# Patient Record
Sex: Male | Born: 1950
Health system: Southern US, Community
[De-identification: ages and names within clinical notes are randomized; demographics above are authoritative.]

## PROBLEM LIST (undated history)

## (undated) DIAGNOSIS — I10 Essential (primary) hypertension: Secondary | ICD-10-CM

## (undated) DIAGNOSIS — E291 Testicular hypofunction: Secondary | ICD-10-CM

## (undated) DIAGNOSIS — E785 Hyperlipidemia, unspecified: Secondary | ICD-10-CM

## (undated) HISTORY — DX: Testicular hypofunction: E29.1

## (undated) HISTORY — DX: Essential (primary) hypertension: I10

## (undated) HISTORY — DX: Hyperlipidemia, unspecified: E78.5

---

## 2017-07-15 DIAGNOSIS — M25512 Pain in left shoulder: Secondary | ICD-10-CM | POA: Diagnosis not present

## 2017-07-15 DIAGNOSIS — G8929 Other chronic pain: Secondary | ICD-10-CM | POA: Diagnosis not present

## 2017-07-15 DIAGNOSIS — R399 Unspecified symptoms and signs involving the genitourinary system: Secondary | ICD-10-CM | POA: Diagnosis not present

## 2017-07-15 DIAGNOSIS — E291 Testicular hypofunction: Secondary | ICD-10-CM | POA: Diagnosis not present

## 2017-07-15 DIAGNOSIS — M8589 Other specified disorders of bone density and structure, multiple sites: Secondary | ICD-10-CM | POA: Diagnosis not present

## 2017-07-15 DIAGNOSIS — E559 Vitamin D deficiency, unspecified: Secondary | ICD-10-CM | POA: Diagnosis not present

## 2017-07-15 DIAGNOSIS — E7800 Pure hypercholesterolemia, unspecified: Secondary | ICD-10-CM | POA: Diagnosis not present

## 2017-07-15 DIAGNOSIS — Z125 Encounter for screening for malignant neoplasm of prostate: Secondary | ICD-10-CM | POA: Diagnosis not present

## 2017-07-15 DIAGNOSIS — R400 Somnolence: Secondary | ICD-10-CM | POA: Diagnosis not present

## 2017-11-18 DIAGNOSIS — E7800 Pure hypercholesterolemia, unspecified: Secondary | ICD-10-CM | POA: Diagnosis not present

## 2017-11-18 DIAGNOSIS — K219 Gastro-esophageal reflux disease without esophagitis: Secondary | ICD-10-CM | POA: Diagnosis not present

## 2017-11-18 DIAGNOSIS — R399 Unspecified symptoms and signs involving the genitourinary system: Secondary | ICD-10-CM | POA: Diagnosis not present

## 2017-11-18 DIAGNOSIS — Z125 Encounter for screening for malignant neoplasm of prostate: Secondary | ICD-10-CM | POA: Diagnosis not present

## 2017-11-18 DIAGNOSIS — M25512 Pain in left shoulder: Secondary | ICD-10-CM | POA: Diagnosis not present

## 2017-11-18 DIAGNOSIS — M8589 Other specified disorders of bone density and structure, multiple sites: Secondary | ICD-10-CM | POA: Diagnosis not present

## 2017-11-18 DIAGNOSIS — E291 Testicular hypofunction: Secondary | ICD-10-CM | POA: Diagnosis not present

## 2017-11-18 DIAGNOSIS — R05 Cough: Secondary | ICD-10-CM | POA: Diagnosis not present

## 2017-11-18 DIAGNOSIS — R400 Somnolence: Secondary | ICD-10-CM | POA: Diagnosis not present

## 2017-11-18 DIAGNOSIS — E559 Vitamin D deficiency, unspecified: Secondary | ICD-10-CM | POA: Diagnosis not present

## 2017-11-20 DIAGNOSIS — E7800 Pure hypercholesterolemia, unspecified: Secondary | ICD-10-CM | POA: Diagnosis not present

## 2017-11-20 DIAGNOSIS — R400 Somnolence: Secondary | ICD-10-CM | POA: Diagnosis not present

## 2017-11-20 DIAGNOSIS — E291 Testicular hypofunction: Secondary | ICD-10-CM | POA: Diagnosis not present

## 2017-11-20 DIAGNOSIS — M8589 Other specified disorders of bone density and structure, multiple sites: Secondary | ICD-10-CM | POA: Diagnosis not present

## 2017-11-20 DIAGNOSIS — M25512 Pain in left shoulder: Secondary | ICD-10-CM | POA: Diagnosis not present

## 2017-11-20 DIAGNOSIS — R399 Unspecified symptoms and signs involving the genitourinary system: Secondary | ICD-10-CM | POA: Diagnosis not present

## 2017-11-20 DIAGNOSIS — E559 Vitamin D deficiency, unspecified: Secondary | ICD-10-CM | POA: Diagnosis not present

## 2017-11-20 DIAGNOSIS — Z125 Encounter for screening for malignant neoplasm of prostate: Secondary | ICD-10-CM | POA: Diagnosis not present

## 2017-12-09 DIAGNOSIS — R9389 Abnormal findings on diagnostic imaging of other specified body structures: Secondary | ICD-10-CM | POA: Diagnosis not present

## 2018-01-02 DIAGNOSIS — Z23 Encounter for immunization: Secondary | ICD-10-CM | POA: Diagnosis not present

## 2018-05-11 DIAGNOSIS — M8589 Other specified disorders of bone density and structure, multiple sites: Secondary | ICD-10-CM | POA: Diagnosis not present

## 2018-05-11 DIAGNOSIS — Z125 Encounter for screening for malignant neoplasm of prostate: Secondary | ICD-10-CM | POA: Diagnosis not present

## 2018-05-11 DIAGNOSIS — R739 Hyperglycemia, unspecified: Secondary | ICD-10-CM | POA: Diagnosis not present

## 2018-05-11 DIAGNOSIS — E7800 Pure hypercholesterolemia, unspecified: Secondary | ICD-10-CM | POA: Diagnosis not present

## 2018-05-11 DIAGNOSIS — E559 Vitamin D deficiency, unspecified: Secondary | ICD-10-CM | POA: Diagnosis not present

## 2018-05-11 DIAGNOSIS — R399 Unspecified symptoms and signs involving the genitourinary system: Secondary | ICD-10-CM | POA: Diagnosis not present

## 2018-05-11 DIAGNOSIS — R400 Somnolence: Secondary | ICD-10-CM | POA: Diagnosis not present

## 2018-05-11 DIAGNOSIS — E291 Testicular hypofunction: Secondary | ICD-10-CM | POA: Diagnosis not present

## 2018-05-11 DIAGNOSIS — Z79899 Other long term (current) drug therapy: Secondary | ICD-10-CM | POA: Diagnosis not present

## 2019-02-02 DIAGNOSIS — Z0000 Encounter for general adult medical examination without abnormal findings: Secondary | ICD-10-CM | POA: Diagnosis not present

## 2019-02-02 DIAGNOSIS — K2100 Gastro-esophageal reflux disease with esophagitis, without bleeding: Secondary | ICD-10-CM | POA: Diagnosis not present

## 2019-02-02 DIAGNOSIS — R030 Elevated blood-pressure reading, without diagnosis of hypertension: Secondary | ICD-10-CM | POA: Diagnosis not present

## 2019-02-02 DIAGNOSIS — E7800 Pure hypercholesterolemia, unspecified: Secondary | ICD-10-CM | POA: Diagnosis not present

## 2019-02-02 DIAGNOSIS — E291 Testicular hypofunction: Secondary | ICD-10-CM | POA: Diagnosis not present

## 2019-02-02 DIAGNOSIS — Z23 Encounter for immunization: Secondary | ICD-10-CM | POA: Diagnosis not present

## 2019-02-02 DIAGNOSIS — Z125 Encounter for screening for malignant neoplasm of prostate: Secondary | ICD-10-CM | POA: Diagnosis not present

## 2019-02-02 DIAGNOSIS — E559 Vitamin D deficiency, unspecified: Secondary | ICD-10-CM | POA: Diagnosis not present

## 2019-02-02 DIAGNOSIS — M25512 Pain in left shoulder: Secondary | ICD-10-CM | POA: Diagnosis not present

## 2019-02-03 DIAGNOSIS — E291 Testicular hypofunction: Secondary | ICD-10-CM | POA: Diagnosis not present

## 2019-02-03 DIAGNOSIS — Z125 Encounter for screening for malignant neoplasm of prostate: Secondary | ICD-10-CM | POA: Diagnosis not present

## 2019-02-03 DIAGNOSIS — R030 Elevated blood-pressure reading, without diagnosis of hypertension: Secondary | ICD-10-CM | POA: Diagnosis not present

## 2019-02-03 DIAGNOSIS — Z0000 Encounter for general adult medical examination without abnormal findings: Secondary | ICD-10-CM | POA: Diagnosis not present

## 2019-02-03 DIAGNOSIS — M25512 Pain in left shoulder: Secondary | ICD-10-CM | POA: Diagnosis not present

## 2019-02-03 DIAGNOSIS — E7800 Pure hypercholesterolemia, unspecified: Secondary | ICD-10-CM | POA: Diagnosis not present

## 2019-02-03 DIAGNOSIS — E559 Vitamin D deficiency, unspecified: Secondary | ICD-10-CM | POA: Diagnosis not present

## 2019-02-03 DIAGNOSIS — K2100 Gastro-esophageal reflux disease with esophagitis, without bleeding: Secondary | ICD-10-CM | POA: Diagnosis not present

## 2019-02-03 DIAGNOSIS — Z23 Encounter for immunization: Secondary | ICD-10-CM | POA: Diagnosis not present

## 2019-04-15 ENCOUNTER — Ambulatory Visit: Payer: Self-pay

## 2019-04-20 ENCOUNTER — Ambulatory Visit: Payer: Self-pay

## 2019-04-23 ENCOUNTER — Ambulatory Visit: Payer: Medicare Other | Attending: Internal Medicine

## 2019-04-23 DIAGNOSIS — Z23 Encounter for immunization: Secondary | ICD-10-CM | POA: Insufficient documentation

## 2019-04-23 NOTE — Progress Notes (Signed)
   Covid-19 Vaccination Clinic  Name:  Sirwilliam Friedel    MRN: [id number] DOB: 03-04-1951  04/23/2019  Mr. Capers was observed post Covid-19 immunization for 15 minutes without incidence. He was provided with Vaccine Information Sheet and instruction to access the V-Safe system.   Mr. Schlough was instructed to call 911 with any severe reactions post vaccine: [name] Difficulty breathing  . Swelling of your face and throat  . A fast heartbeat  . A bad rash all over your body  . Dizziness and weakness    Immunizations Administered    Name Date Dose VIS Date Route   Pfizer COVID-19 Vaccine 04/23/2019  5:10 PM 0.3 mL 02/26/2019 Intramuscular   Manufacturer: [place]   Lot: [id number]   [place]: [id number]

## 2019-05-11 DIAGNOSIS — E291 Testicular hypofunction: Secondary | ICD-10-CM | POA: Diagnosis not present

## 2019-05-11 DIAGNOSIS — R011 Cardiac murmur, unspecified: Secondary | ICD-10-CM | POA: Diagnosis not present

## 2019-05-11 DIAGNOSIS — E669 Obesity, unspecified: Secondary | ICD-10-CM | POA: Diagnosis not present

## 2019-05-11 DIAGNOSIS — E559 Vitamin D deficiency, unspecified: Secondary | ICD-10-CM | POA: Diagnosis not present

## 2019-05-11 DIAGNOSIS — E7800 Pure hypercholesterolemia, unspecified: Secondary | ICD-10-CM | POA: Diagnosis not present

## 2019-05-19 ENCOUNTER — Ambulatory Visit: Payer: Medicare Other | Attending: Internal Medicine

## 2019-05-19 DIAGNOSIS — Z23 Encounter for immunization: Secondary | ICD-10-CM | POA: Insufficient documentation

## 2019-05-19 NOTE — Progress Notes (Signed)
   Covid-19 Vaccination Clinic  Name:  Charles Allen    MRN: [id number] DOB: 07/12/1950  05/19/2019  Charles Allen was observed post Covid-19 immunization for 15 minutes without incident. He was provided with Vaccine Information Sheet and instruction to access the V-Safe system.   Charles Allen was instructed to call 911 with any severe reactions post vaccine: [name] Difficulty breathing  . Swelling of face and throat  . A fast heartbeat  . A bad rash all over body  . Dizziness and weakness   Immunizations Administered    Name Date Dose VIS Date Route   Pfizer COVID-19 Vaccine 05/19/2019  1:07 PM 0.3 mL 02/26/2019 Intramuscular   Manufacturer: [place]   Lot: [id number]   [place]: [id number]

## 2019-05-27 DIAGNOSIS — E559 Vitamin D deficiency, unspecified: Secondary | ICD-10-CM | POA: Diagnosis not present

## 2019-05-27 DIAGNOSIS — E291 Testicular hypofunction: Secondary | ICD-10-CM | POA: Diagnosis not present

## 2019-05-27 DIAGNOSIS — E7800 Pure hypercholesterolemia, unspecified: Secondary | ICD-10-CM | POA: Diagnosis not present

## 2020-02-29 DIAGNOSIS — Z125 Encounter for screening for malignant neoplasm of prostate: Secondary | ICD-10-CM | POA: Diagnosis not present

## 2020-02-29 DIAGNOSIS — E559 Vitamin D deficiency, unspecified: Secondary | ICD-10-CM | POA: Diagnosis not present

## 2020-02-29 DIAGNOSIS — E291 Testicular hypofunction: Secondary | ICD-10-CM | POA: Diagnosis not present

## 2020-02-29 DIAGNOSIS — Z1211 Encounter for screening for malignant neoplasm of colon: Secondary | ICD-10-CM | POA: Diagnosis not present

## 2020-02-29 DIAGNOSIS — Z0000 Encounter for general adult medical examination without abnormal findings: Secondary | ICD-10-CM | POA: Diagnosis not present

## 2020-02-29 DIAGNOSIS — E7800 Pure hypercholesterolemia, unspecified: Secondary | ICD-10-CM | POA: Diagnosis not present

## 2020-02-29 DIAGNOSIS — Z1389 Encounter for screening for other disorder: Secondary | ICD-10-CM | POA: Diagnosis not present

## 2020-02-29 DIAGNOSIS — R739 Hyperglycemia, unspecified: Secondary | ICD-10-CM | POA: Diagnosis not present

## 2020-02-29 DIAGNOSIS — Z79899 Other long term (current) drug therapy: Secondary | ICD-10-CM | POA: Diagnosis not present

## 2020-03-02 DIAGNOSIS — Z1211 Encounter for screening for malignant neoplasm of colon: Secondary | ICD-10-CM | POA: Diagnosis not present

## 2020-03-13 DIAGNOSIS — E559 Vitamin D deficiency, unspecified: Secondary | ICD-10-CM | POA: Diagnosis not present

## 2020-03-13 DIAGNOSIS — R739 Hyperglycemia, unspecified: Secondary | ICD-10-CM | POA: Diagnosis not present

## 2020-03-13 DIAGNOSIS — I208 Other forms of angina pectoris: Secondary | ICD-10-CM | POA: Diagnosis not present

## 2020-03-13 DIAGNOSIS — E7800 Pure hypercholesterolemia, unspecified: Secondary | ICD-10-CM | POA: Diagnosis not present

## 2020-03-13 DIAGNOSIS — R011 Cardiac murmur, unspecified: Secondary | ICD-10-CM | POA: Diagnosis not present

## 2020-03-13 DIAGNOSIS — Z79899 Other long term (current) drug therapy: Secondary | ICD-10-CM | POA: Diagnosis not present

## 2020-03-13 DIAGNOSIS — E291 Testicular hypofunction: Secondary | ICD-10-CM | POA: Diagnosis not present

## 2020-03-15 NOTE — Progress Notes (Deleted)
    Patient referred by No ref. provider found for ***  Subjective:   Charles Allen, male    DOB: 1951/01/25, 69 y.o.   MRN: [id number]  *** No chief complaint on file.   *** HPI  69 y.o. *** male with ***  *** No past medical history on file.  *** *** The histories are not reviewed yet. Please review them in the "History" navigator section and refresh this SmartLink.  *** Social History   Tobacco Use  Smoking Status Not on file  Smokeless Tobacco Not on file    Social History   Substance and Sexual Activity  Alcohol Use Not on file    *** No family history on file.  *** No current outpatient medications on file prior to visit.   No current facility-administered medications on file prior to visit.    Cardiovascular and other pertinent studies:  *** EKG 03/13/2020: ***  *** Recent labs: 02/29/2020: Glucose 77, BUN/Cr 17/0.98. EGFR 76. Na/K 139/4.3. ***Rest of the CMP normal H/H 14.1/41.4. MCV 83.0. Platelets 284 ***HbA1C ***% Chol 227, TG 191, HDL ***, LDL 155 ***TSH ***normal   *** ROS      *** There were no vitals filed for this visit.   There is no height or weight on file to calculate BMI. There were no vitals filed for this visit.  *** Objective:   Physical Exam    ***     Assessment & Recommendations:   ***  ***  Thank you for referring the patient to [place]. Please feel free to contact with any questions.   Nigel Mormon, MD Pager: [phone] Office: [phone]

## 2020-03-16 ENCOUNTER — Ambulatory Visit: Payer: Medicare Other | Admitting: Cardiology

## 2020-03-16 ENCOUNTER — Other Ambulatory Visit: Payer: Self-pay

## 2020-03-16 ENCOUNTER — Encounter: Payer: Self-pay | Admitting: Cardiology

## 2020-03-16 VITALS — BP 141/80 | HR 67 | Resp 16 | Ht 67.0 in | Wt 181.0 lb

## 2020-03-16 DIAGNOSIS — E782 Mixed hyperlipidemia: Secondary | ICD-10-CM | POA: Insufficient documentation

## 2020-03-16 DIAGNOSIS — I208 Other forms of angina pectoris: Secondary | ICD-10-CM | POA: Insufficient documentation

## 2020-03-16 DIAGNOSIS — I10 Essential (primary) hypertension: Secondary | ICD-10-CM | POA: Diagnosis not present

## 2020-03-16 MED ORDER — ATORVASTATIN CALCIUM 20 MG PO TABS: 20.0000 mg | ORAL_TABLET | Freq: Every day | ORAL | 3 refills | Status: DC

## 2020-03-16 MED ORDER — AMLODIPINE BESYLATE 5 MG PO TABS: 5.0000 mg | ORAL_TABLET | Freq: Every day | ORAL | 3 refills | Status: DC

## 2020-03-16 NOTE — Progress Notes (Signed)
Patient referred by [name], MD for chest pain  Subjective:   Charles Allen, male    DOB: 1950-03-29, 69 y.o.   MRN: [id number]   Chief Complaint  Patient presents with  . Chest Pain  . New Patient (Initial Visit)     HPI  69 y.o. Caucasian male with hyperlipidemia, hyperglycemia, hypogonadism, referred progression of exercise-induced chest pain.  Patient has noted retrosternal chest pain with walking, improves with rest. He has not had to use nitroglycerin. He has been on lipitor 10 mg in the past, but is very reluctant to start statin therapy. He takes Aspirin 81 mg daily.    Past Medical History:  Diagnosis Date  . Hyperlipidemia   . Hypogonadism in male      History reviewed. No pertinent surgical history.   Social History   Tobacco Use  Smoking Status Never Smoker  Smokeless Tobacco Never Used    Social History   Substance and Sexual Activity  Alcohol Use Yes  . Alcohol/week: 1.0 standard drink  . Types: 1 Glasses of wine per week   Comment: occ     Family History  Problem Relation Age of Onset  . Lung cancer Mother      Current Outpatient Medications on File Prior to Visit  Medication Sig Dispense Refill  . aspirin 81 MG chewable tablet Chew 81 mg by mouth daily.    . cholecalciferol (VITAMIN D) 25 MCG (1000 UNIT) tablet Take 1,000 Units by mouth daily.    . diclofenac Sodium (VOLTAREN) 1 % GEL Apply topically See admin instructions.    . nitroGLYCERIN (NITROSTAT) 0.3 MG SL tablet Place 1 tablet under the tongue as needed.    . testosterone (ANDROGEL) 50 MG/5GM (1%) GEL as directed.    . Turmeric 500 MG CAPS Take 500 mg by mouth daily.     No current facility-administered medications on file prior to visit.    Cardiovascular and other pertinent studies:  EKG 03/16/2020: Sinus rhythm 73 bpm Nrmal EKG  EKG 03/13/2020: Sinus rhythm 75 bpm Normal EKG   Recent labs: 02/29/2020: Glucose 77, BUN/Cr 17/0.98. EGFR 76. Na/K  139/4.3. Rest of the CMP normal H/H 14/41. MCV 83. Platelets 284 HbA1C 5.7% Chol 227, TG 191, HDL 37, LDL 155 Testosterone 266 low    Review of Systems  Cardiovascular: Positive for chest pain. Negative for dyspnea on exertion, leg swelling, palpitations and syncope.         Vitals:   03/16/20 1356 03/16/20 1357  BP: (!) 144/90 (!) 141/80  Pulse: 70 67  Resp: 16   SpO2: 96% 96%     Body mass index is 28.35 kg/m. Filed Weights   03/16/20 1356  Weight: 181 lb (82.1 kg)     Objective:   Physical Exam Vitals and nursing note reviewed.  Constitutional:      General: He is not in acute distress. Neck:     Vascular: No JVD.  Cardiovascular:     Rate and Rhythm: Normal rate and regular rhythm.     Heart sounds: Normal heart sounds. No murmur heard.   Pulmonary:     Effort: Pulmonary effort is normal.     Breath sounds: Normal breath sounds. No wheezing or rales.  Musculoskeletal:     Right lower leg: No edema.     Left lower leg: No edema.         Assessment & Recommendations:   69 y.o. Caucasian male with hyperlipidemia, hyperglycemia, hypogonadism, referred  progression of exercise-induced chest pain.  Stable angina: No unstable symptoms. Continue Aspirin 81 mg. He has agreed to take Lipitor 20 mg Start amlodipine 5 mg, SL NTG Will check exercise nuclear stress test, echocardiogram  Thank you for referring the patient to [place]. Please feel free to contact with any questions.   Nigel Mormon, MD Pager: [phone] Office: [phone]

## 2020-03-20 ENCOUNTER — Ambulatory Visit: Payer: Medicare Other | Admitting: Cardiology

## 2020-03-29 ENCOUNTER — Ambulatory Visit: Payer: Medicare Other

## 2020-03-29 ENCOUNTER — Other Ambulatory Visit: Payer: Self-pay

## 2020-03-29 DIAGNOSIS — I208 Other forms of angina pectoris: Secondary | ICD-10-CM | POA: Diagnosis not present

## 2020-03-30 ENCOUNTER — Other Ambulatory Visit: Payer: Self-pay | Admitting: Cardiology

## 2020-03-30 ENCOUNTER — Ambulatory Visit: Payer: Medicare Other

## 2020-03-30 DIAGNOSIS — I208 Other forms of angina pectoris: Secondary | ICD-10-CM | POA: Diagnosis not present

## 2020-03-30 DIAGNOSIS — Z23 Encounter for immunization: Secondary | ICD-10-CM | POA: Diagnosis not present

## 2020-03-30 MED ORDER — METOPROLOL SUCCINATE ER 25 MG PO TB24: 25.0000 mg | ORAL_TABLET | Freq: Every day | ORAL | 1 refills | Status: DC

## 2020-03-30 NOTE — Progress Notes (Signed)
Agree with stress test findings. Possibility of balanced ischemia.  Start metoprolol succinate 25 mg daily. F/u on 04/10/2020.   Nigel Mormon, MD Pager: [phone] Office: [phone]

## 2020-04-10 ENCOUNTER — Other Ambulatory Visit: Payer: Self-pay

## 2020-04-10 ENCOUNTER — Encounter: Payer: Self-pay | Admitting: Cardiology

## 2020-04-10 ENCOUNTER — Ambulatory Visit: Payer: Medicare Other | Admitting: Cardiology

## 2020-04-10 VITALS — BP 138/77 | HR 72 | Temp 98.2°F | Ht 67.0 in | Wt 178.0 lb

## 2020-04-10 DIAGNOSIS — E782 Mixed hyperlipidemia: Secondary | ICD-10-CM | POA: Diagnosis not present

## 2020-04-10 DIAGNOSIS — I208 Other forms of angina pectoris: Secondary | ICD-10-CM | POA: Diagnosis not present

## 2020-04-10 NOTE — Progress Notes (Signed)
Patient referred by [name], MD for chest pain  Subjective:   Charles Allen, male    DOB: 02-09-1951, 70 y.o.   MRN: [id number]   Chief Complaint  Patient presents with  . Chest Pain  . Follow-up     Chest Pain  Pertinent negatives include no palpitations or syncope.    70 y.o. Caucasian male with mixed hyperlipidemia, stable angina  Patient has been doing well without any symptoms of angina, after being on medical therapy. That said, he has not been the level of activity that would previously produce symptoms-such as walking 1-2 mile.s He shoveled the snow recently without any symptoms.  Discussed the recent echocardiogram and stress test results with the patient, details below.      Current Outpatient Medications on File Prior to Visit  Medication Sig Dispense Refill  . amLODipine (NORVASC) 5 MG tablet Take 1 tablet (5 mg total) by mouth daily. 180 tablet 3  . aspirin 81 MG chewable tablet Chew 81 mg by mouth daily.    [name] atorvastatin (LIPITOR) 20 MG tablet Take 1 tablet (20 mg total) by mouth daily. 90 tablet 3  . b complex vitamins capsule Take 1 capsule by mouth daily.    . cholecalciferol (VITAMIN D) 25 MCG (1000 UNIT) tablet Take 1,000 Units by mouth daily.    . diclofenac Sodium (VOLTAREN) 1 % GEL Apply topically See admin instructions.    . metoprolol succinate (TOPROL-XL) 25 MG 24 hr tablet TAKE 1 TABLET(25 MG) BY MOUTH DAILY WITH OR IMMEDIATELY FOLLOWING A MEAL 90 tablet 0  . nitroGLYCERIN (NITROSTAT) 0.3 MG SL tablet Place 1 tablet under the tongue as needed.    . testosterone (ANDROGEL) 50 MG/5GM (1%) GEL as directed.    . Turmeric 500 MG CAPS Take 500 mg by mouth daily.     No current facility-administered medications on file prior to visit.    Cardiovascular and other pertinent studies:  Echocardiogram 03/30/2020:  Left ventricle cavity is normal in size and wall thickness. Normal global  wall motion. Normal LV systolic function with EF 70%.  Normal diastolic  filling pattern.  Structurally normal trileaflet aortic valve. Mild (Grade I) aortic  regurgitation.  Mild tricuspid regurgitation.  No evidence of pulmonary hypertension.  Exercise Myoview stress test 03/29/2020: 1 Day Rest/Stress Protocol. Functional status: Good. Chest pain: Present, nonlimiting but relieved by sublingual nitro. Reason for stopping exercise: Fatigue. Hypertensive response to exercise: None. Patient exercised for 7 minutes and 33 seconds on Bruce protocol, achieved 9.42 METS, and 88% of APMHR.  Stress ECG positive for ischemia. No obvious evidence of reversible ischemia or prior infarct.  Calculated LVEF 47%, visually appears normal.  Left ventricular wall thickness is preserved without regional wall motion abnormalities. High risk study, due to chest pain with exercise relieved by sublingual nitro, stress ECG positive for ischemia with ectopy at peak exercise, mildly reduced LVEF per gated SPECT.  Clinical correlation required.  EKG 03/16/2020: Sinus rhythm 73 bpm Nrmal EKG  EKG 03/13/2020: Sinus rhythm 75 bpm Normal EKG   Recent labs: 02/29/2020: Glucose 77, BUN/Cr 17/0.98. EGFR 76. Na/K 139/4.3. Rest of the CMP normal H/H 14/41. MCV 83. Platelets 284 HbA1C 5.7% Chol 227, TG 191, HDL 37, LDL 155 Testosterone 266 low    Review of Systems  Cardiovascular: Positive for chest pain. Negative for dyspnea on exertion, leg swelling, palpitations and syncope.         Vitals:   04/10/20 1015  BP: 138/77  Pulse: 72  Temp: 98.2 F (36.8 C)  SpO2: 96%     Body mass index is 27.88 kg/m. Filed Weights   04/10/20 1015  Weight: 178 lb (80.7 kg)     Objective:   Physical Exam Vitals and nursing note reviewed.  Constitutional:      General: He is not in acute distress. Neck:     Vascular: No JVD.  Cardiovascular:     Rate and Rhythm: Normal rate and regular rhythm.     Heart sounds: Normal heart sounds. No murmur  heard.   Pulmonary:     Effort: Pulmonary effort is normal.     Breath sounds: Normal breath sounds. No wheezing or rales.  Musculoskeletal:     Right lower leg: No edema.     Left lower leg: No edema.         Assessment & Recommendations:   70 y.o. Caucasian male with mixed hyperlipidemia, stable angina  Stable angina: Symptoms improved on medical therapy and reduced physical activity. I reviewed stress test results with the patient, in detail.  He had fair exercise capacity, reaching 9 METS.  He did have ischemic changes on EKG, without any ischemia seen on SPECT imaging.  Although 3 times daily was normal, overall picture does raise suspicion of balanced ischemia.  His LVEF was normal.  I explained the differentiation between known left main disease, versus left main disease.  In absence of left main disease and LV dysfunction, continued medical management would be appropriate in absence of symptoms.  However, if he were to have left main disease, this increases risk of sudden death and would warrant revascularization.  Therefore, it is of paramount importance to obtain coronary anatomy visualization.  I offered CT coronary angiogram versus invasive angiography.  In the setting that he does not have any anginal symptoms at this time, he would like to hold off the testing.  She understands the pros and cons of this approach.  I would continue medical management including aspirin, statin, amlodipine, metoprolol at this time.  When patient decides to proceed with it, he will contact us, and I will order a coronary CT angiogram.  Otherwise, I will see him back in 3 months.    Nigel Mormon, MD Pager: [phone] Office: [phone]

## 2020-04-26 ENCOUNTER — Other Ambulatory Visit: Payer: Self-pay | Admitting: Cardiology

## 2020-04-26 DIAGNOSIS — R931 Abnormal findings on diagnostic imaging of heart and coronary circulation: Secondary | ICD-10-CM

## 2020-04-26 DIAGNOSIS — I208 Other forms of angina pectoris: Secondary | ICD-10-CM

## 2020-04-26 NOTE — Addendum Note (Signed)
Addended by: Nigel Mormon on: 04/26/2020 12:38 PM   Modules accepted: Orders

## 2020-04-28 DIAGNOSIS — E782 Mixed hyperlipidemia: Secondary | ICD-10-CM | POA: Diagnosis not present

## 2020-04-28 DIAGNOSIS — I208 Other forms of angina pectoris: Secondary | ICD-10-CM | POA: Diagnosis not present

## 2020-04-29 LAB — BASIC METABOLIC PANEL
BUN/Creatinine Ratio: 13 (ref 10–24)
BUN: 13 mg/dL (ref 8–27)
CO2: 19 mmol/L — ABNORMAL LOW (ref 20–29)
Calcium: 9.3 mg/dL (ref 8.6–10.2)
Chloride: 104 mmol/L (ref 96–106)
Creatinine, Ser: 1.04 mg/dL (ref 0.76–1.27)
GFR calc Af Amer: 85 mL/min/{1.73_m2} (ref >59)
GFR calc non Af Amer: 74 mL/min/{1.73_m2} (ref >59)
Glucose: 97 mg/dL (ref 65–99)
Potassium: 4.3 mmol/L (ref 3.5–5.2)
Sodium: 139 mmol/L (ref 134–144)

## 2020-04-29 LAB — LIPID PANEL
Chol/HDL Ratio: 3.1 ratio (ref 0.0–5.0)
Cholesterol, Total: 115 mg/dL (ref 100–199)
HDL: 37 mg/dL — ABNORMAL LOW (ref >39)
LDL Chol Calc (NIH): 63 mg/dL (ref 0–99)
Triglycerides: 70 mg/dL (ref 0–149)
VLDL Cholesterol Cal: 15 mg/dL (ref 5–40)

## 2020-05-01 ENCOUNTER — Telehealth (HOSPITAL_COMMUNITY): Payer: Self-pay | Admitting: Emergency Medicine

## 2020-05-01 ENCOUNTER — Telehealth (HOSPITAL_COMMUNITY): Payer: Self-pay | Admitting: *Deleted

## 2020-05-01 NOTE — Telephone Encounter (Signed)
Attempted to call patient regarding upcoming cardiac CT appointment. °Left message on voicemail with name and callback number ° °Devlon Dosher RN Navigator Cardiac Imaging °[facility] °[phone] Office °[phone] Cell ° °

## 2020-05-01 NOTE — Telephone Encounter (Signed)
Pt returning phone call regarding upcoming cardiac imaging study; pt verbalizes understanding of appt date/time, parking situation and where to check in, pre-test NPO status and medications ordered, and verified current allergies; name and call back number provided for further questions should they arise Charles Bond RN [facility] [phone] office [phone] cell   Pt states he will take 50mg  metoprolol succinate 2 hr prior to scan Clarise Cruz

## 2020-05-03 ENCOUNTER — Ambulatory Visit (HOSPITAL_COMMUNITY)
Admission: RE | Admit: 2020-05-03 | Discharge: 2020-05-03 | Disposition: A | Discharge status: Home / Self Care | Payer: Medicare Other | Source: Ambulatory Visit | Attending: Cardiology | Admitting: Cardiology

## 2020-05-03 ENCOUNTER — Encounter (HOSPITAL_COMMUNITY): Payer: Self-pay

## 2020-05-03 ENCOUNTER — Other Ambulatory Visit: Payer: Self-pay

## 2020-05-03 DIAGNOSIS — I208 Other forms of angina pectoris: Secondary | ICD-10-CM

## 2020-05-03 DIAGNOSIS — Z006 Encounter for examination for normal comparison and control in clinical research program: Secondary | ICD-10-CM

## 2020-05-03 DIAGNOSIS — R931 Abnormal findings on diagnostic imaging of heart and coronary circulation: Secondary | ICD-10-CM

## 2020-05-03 DIAGNOSIS — I2089 Other forms of angina pectoris: Secondary | ICD-10-CM

## 2020-05-03 IMAGING — CT CT HEART MORP W/ CTA COR W/ SCORE W/ CA W/CM &/OR W/O CM
4 of 7 series · 8 of 20 positions shown, 9 images · IV contrast (APPLIED)
Comparison: None.
COMPARISON: None.

Addendum:
EXAM:
OVER-READ INTERPRETATION  CT CHEST

The following report is an over-read performed by radiologist Dr.
Gaio Musa [REDACTED] on 05/03/2020. This
over-read does not include interpretation of cardiac or coronary
anatomy or pathology. The coronary calcium score/coronary CTA
interpretation by the cardiologist is attached.
HISTORY: Chest pain/anginal equiv, ECGs or troponins abnormal
Cardiac/Coronary  CT
TECHNIQUE: The patient was scanned on a Siemens Force scanner.
PROTOCOL: A 120 kV prospective scan was triggered in the descending thoracic
aorta at 111 HU's. Axial non-contrast 3 mm slices were carried out
through the heart. The data set was analyzed on a dedicated work
station and scored using the Agatson method. Gantry rotation speed
was 250 msecs and collimation was .6 mm. No IV beta blockade but
mg of sl NTG was given. The 3D data set was reconstructed in 5%
intervals of the 67-82 % of the R-R cycle. Diastolic phases were
analyzed on a dedicated work station using MPR, MIP and VRT modes.
The patient received 90mL OMNIPAQUE IOHEXOL 350 MG/ML SOLN of
contrast.

[Series 6: best diast 75 % · axial · 0.39mm/px · z∈[+1137,+1174]mm · 2 of 283 slices shown]
[im 95/283  vessel]
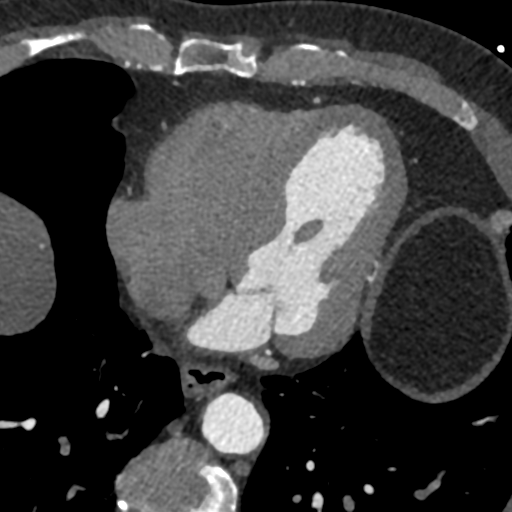
[im 189/283  vessel]
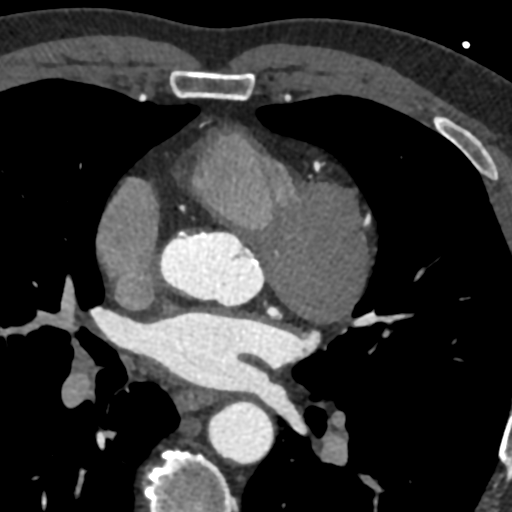

[Series 7: best syst · axial · 0.39mm/px · z∈[+1137,+1174]mm · 2 of 283 slices shown, 3 images]
[im 95/283  vessel]
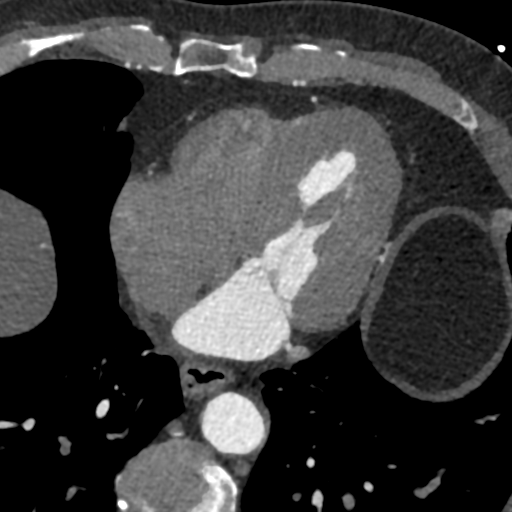
[im 95/283  lung]
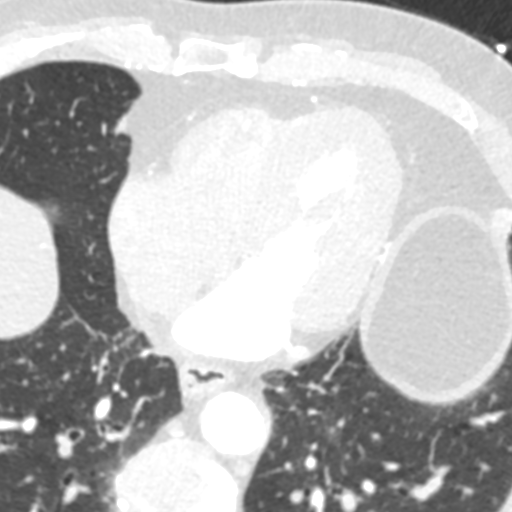
[im 189/283  vessel]
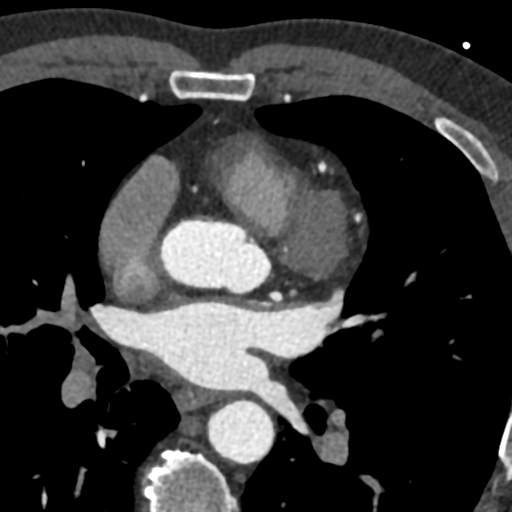

[Series 8: ts diast sharp 75 % · axial · 0.39mm/px · z∈[+1137,+1174]mm · 2 of 283 slices shown]
[im 95/283  lung]
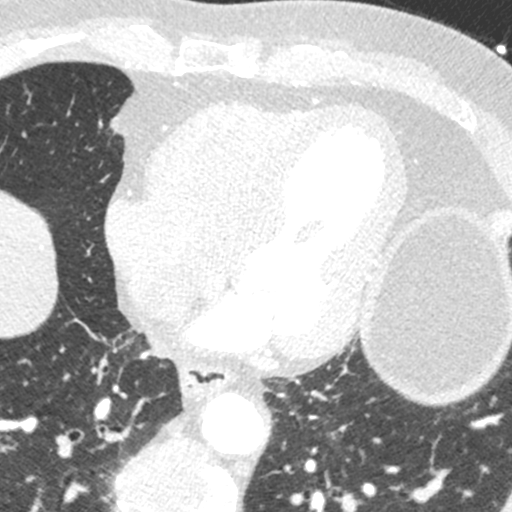
[im 189/283  lung]
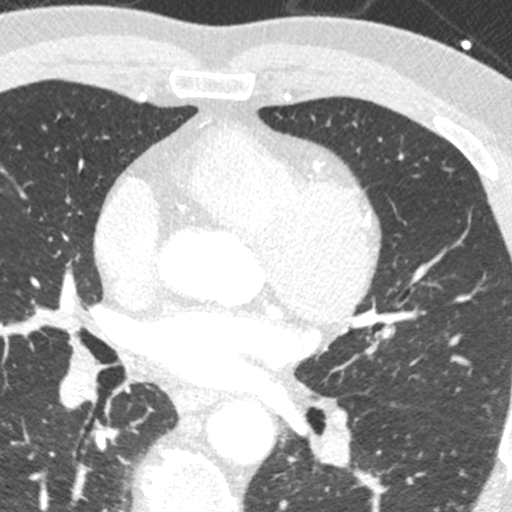

[Series 9: ts syst sharp · axial · 0.39mm/px · z∈[+1137,+1174]mm · 2 of 283 slices shown]
[im 95/283  lung]
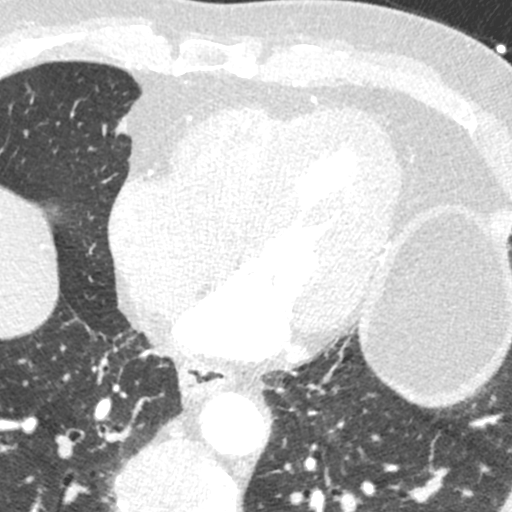
[im 189/283  lung]
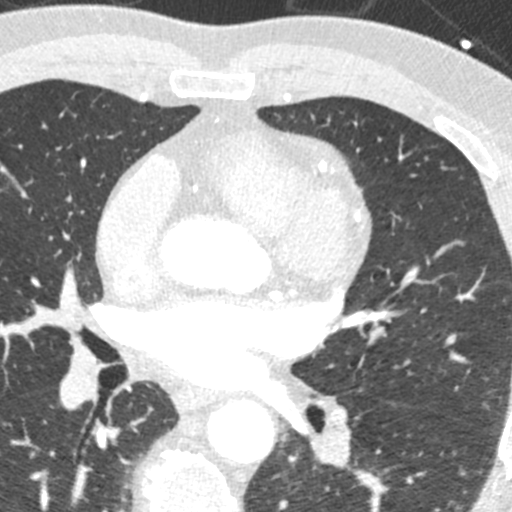

[8 of 20 positions shown; findings below may reference images not displayed]

FINDINGS: Aortic atherosclerosis. Within the visualized portions of the thorax
there are no suspicious appearing pulmonary nodules or masses, there
is no acute consolidative airspace disease, no pleural effusions, no
pneumothorax and no lymphadenopathy. Visualized portions of the
upper abdomen are unremarkable. There are no aggressive appearing
lytic or blastic lesions noted in the visualized portions of the
skeleton.
IMPRESSION: 1.  Aortic Atherosclerosis (NXIYL-YLO.O).
FINDINGS: Image quality: Average.

Noise artifact is: Limited.

Coronary artery calcification score:

Left main: 0

Left anterior descending artery: 195

Left circumflex artery: 152

Right coronary artery: 9

Total calcium score: 356 MOUTON

Coronary calcium score is 356, which places the patient in the 73th
percentile for age and sex matched control.

Coronary arteries: Normal coronary origins.  Left dominance.

Left Main Coronary Artery: The left main is a normal caliber vessel
with a normal take off from the left coronary cusp that bifurcates
to form a left anterior descending artery and a left circumflex
artery. There is no plaque or stenosis.

Left Anterior Descending Coronary Artery: The LAD normal caliber
vessel that wraps the apex and gives off 1 patent diagonal branch.
There is segment of mixed plaque in the proximal to mid LAD that
accounts for moderate to severe stenosis. Mid to distal segment
overall patent. First diagonal branch is large in size and normal
caliber vessel with moderate stenosis (50-69%) at the ostial segment
due to calcified plaque remainder of the vessel is patent.

Left Circumflex Artery: The LCX is dominant and terminates as a PDA
and left posterolateral branch. The LCX gives off 1 patent obtuse
marginal branches. Mild stenosis (25-49%) in the proximal to mid LCx
due to calcified plaque. Mid to distal vessel is patent.

Right Coronary Artery: The RCA is non-dominant vessel with normal
take off from the right coronary cusp. Mild stenosis (25-49%) in the
proximal RCA due to mixed plaque. Mid to distal RCA overall patent
with minimal plaque and without significant stenosis.

Left Atrium: Left atrium is grossly normal in size with no left
atrial appendage filling defect.

Left Ventricle: The ventricular cavity size is grossly within normal
limits. There are no stigmata of prior infarction. There is no
abnormal filling defect.

Pulmonary arteries: Normal in size without proximal filling defect.

Pulmonary veins: Normal pulmonary venous drainage.

Aorta: Normal size, 31 mm at the mid ascending aorta (level of the
PA bifurcation) measured double oblique. Aortic atherosclerosis. No
dissection.

Pericardium: Normal thickness with no significant effusion or
calcium present.

Cardiac valves: The aortic valve is trileaflet with valvular
calcification. The mitral valve is normal structure with minimal
calcification of the mitral apparatus.

Extra-cardiac findings: See attached radiology report for
non-cardiac structures.
IMPRESSION: 1. Coronary calcium score of 356. This was 73th percentile for age
and sex matched control.

2. Normal coronary origin with left dominance.

3.  Ascending aortic atherosclerosis.

4.  Trileaflet aortic valve with valvular calcification.

5. CAD-RADS = 4. Mixed plaque in the proximal to mid LAD that
accounts for moderate to severe stenosis. Moderate stenosis (50-69%)
at the ostial segment of the first diagonal branch due to calcified
plaque. Mild stenosis (25-49%) in the proximal to mid LCx due to
calcified plaque. Mild stenosis (25-49%) in the proximal RCA due to
mixed plaque.

6. Study is sent for CT-FFR to further evaluate the proximal to mid
LAD stenosis. The findings will be performed and reported
separately.

RECOMMENDATIONS:

Consider symptom-guided anti-ischemic pharmacotherapy as well as
risk factor modification per guideline directed care. If either
CT-FFR notes hemodynamically significant stenosis or patient is
symptomatic despite uptitration of anti-anginal therapy recommend
invasive coronary angiography; however, clinical correlation is
strongly required.

*** End of Addendum ***
EXAM:
OVER-READ INTERPRETATION  CT CHEST

The following report is an over-read performed by radiologist Dr.
Gaio Musa [REDACTED] on 05/03/2020. This
over-read does not include interpretation of cardiac or coronary
anatomy or pathology. The coronary calcium score/coronary CTA
interpretation by the cardiologist is attached.
FINDINGS: Aortic atherosclerosis. Within the visualized portions of the thorax
there are no suspicious appearing pulmonary nodules or masses, there
is no acute consolidative airspace disease, no pleural effusions, no
pneumothorax and no lymphadenopathy. Visualized portions of the
upper abdomen are unremarkable. There are no aggressive appearing
lytic or blastic lesions noted in the visualized portions of the
skeleton.
IMPRESSION: 1.  Aortic Atherosclerosis (NXIYL-YLO.O).

## 2020-05-03 MED ORDER — NITROGLYCERIN 0.4 MG SL SUBL: 0.8000 mg | SUBLINGUAL_TABLET | Freq: Once | SUBLINGUAL | Status: DC

## 2020-05-03 MED ORDER — IOHEXOL 350 MG/ML SOLN
90.0000 mL | Freq: Once | INTRAVENOUS | Status: AC | PRN
  Administered 2020-05-03: 90 mL via INTRAVENOUS

## 2020-05-03 MED ORDER — NITROGLYCERIN 0.4 MG SL SUBL
SUBLINGUAL_TABLET | SUBLINGUAL | Status: AC
  Filled 2020-05-03: qty 2

## 2020-05-03 NOTE — Research (Signed)
IDENTIFY Informed Consent                  Subject Name: Charles Allen    Subject met inclusion and exclusion criteria.  The informed consent form, study requirements and expectations were reviewed with the subject and questions and concerns were addressed prior to the signing of the consent form.  The subject verbalized understanding of the trial requirements.  The subject agreed to participate in the IDENTIFY trial and signed the informed consent at 14:14PM on 05/03/20.  The informed consent was obtained prior to performance of any protocol-specific procedures for the subject.  A copy of the signed informed consent was given to the subject and a copy was placed in the subject's medical record.   Cindy Johnson, Research Assistant  

## 2020-05-05 NOTE — Progress Notes (Signed)
Sch for 05/10/2020 @ 1:30pm

## 2020-05-10 ENCOUNTER — Ambulatory Visit: Payer: Medicare Other | Admitting: Cardiology

## 2020-05-10 ENCOUNTER — Other Ambulatory Visit: Payer: Self-pay

## 2020-05-10 ENCOUNTER — Encounter: Payer: Self-pay | Admitting: Cardiology

## 2020-05-10 VITALS — BP 138/74 | HR 73 | Temp 98.2°F | Resp 17 | Ht 67.0 in | Wt 181.6 lb

## 2020-05-10 DIAGNOSIS — I208 Other forms of angina pectoris: Secondary | ICD-10-CM | POA: Diagnosis not present

## 2020-05-10 NOTE — Progress Notes (Signed)
Patient referred by [name], MD for chest pain  Subjective:   Charles Allen, male    DOB: January 06, 1951, 70 y.o.   MRN: [id number]   Chief Complaint  Patient presents with  . Follow-up    1-2 weeks  . Results    70 y.o. Caucasian male with mixed hyperlipidemia, stable angina  Patient recently underwent coronary CT angiogram, details below.  He has not been doing much in terms of heavy exertional physical activity, such as Wel[employer]ol.  It is regular walking, he occasionally gets chest pain.  He has not had to take any nitroglycerin.      Current Outpatient Medications on File Prior to Visit  Medication Sig Dispense Refill  . amLODipine (NORVASC) 5 MG tablet Take 1 tablet (5 mg total) by mouth daily. 180 tablet 3  . aspirin 81 MG chewable tablet Chew 81 mg by mouth daily.    [name] atorvastatin (LIPITOR) 20 MG tablet Take 1 tablet (20 mg total) by mouth daily. 90 tablet 3  . b complex vitamins capsule Take 1 capsule by mouth daily.    . cholecalciferol (VITAMIN D) 25 MCG (1000 UNIT) tablet Take 1,000 Units by mouth daily.    . diclofenac Sodium (VOLTAREN) 1 % GEL Apply topically See admin instructions.    . metoprolol succinate (TOPROL-XL) 25 MG 24 hr tablet TAKE 1 TABLET(25 MG) BY MOUTH DAILY WITH OR IMMEDIATELY FOLLOWING A MEAL 90 tablet 0  . nitroGLYCERIN (NITROSTAT) 0.3 MG SL tablet Place 1 tablet under the tongue as needed.    . testosterone (ANDROGEL) 50 MG/5GM (1%) GEL as directed.    . Turmeric 500 MG CAPS Take 500 mg by mouth daily.     No current facility-administered medications on file prior to visit.    Cardiovascular and other pertinent studies:  Coronary CTA 05/03/2020: 1. Coronary calcium score of 356. This was 75th percentile for age and sex matched control. 2. Normal coronary origin with left dominance. 3.  Ascending aortic atherosclerosis. 4.  Trileaflet aortic valve with valvular calcification. 5. CAD-RADS = 4. Mixed plaque in the proximal to mid LAD  that accounts for moderate to severe stenosis. Moderate stenosis (50-69%) at the ostial segment of the first diagonal branch due to calcified plaque. Mild stenosis (25-49%) in the proximal to mid LCx due to calcified plaque. Mild stenosis (25-49%) in the proximal RCA due to mixed plaque. 6. Study is sent for CT-FFR to further evaluate the proximal to mid LAD stenosis. The findings will be performed and reported separately.  Echocardiogram 03/30/2020:  Left ventricle cavity is normal in size and wall thickness. Normal global  wall motion. Normal LV systolic function with EF 70%. Normal diastolic  filling pattern.  Structurally normal trileaflet aortic valve. Mild (Grade I) aortic  regurgitation.  Mild tricuspid regurgitation.  No evidence of pulmonary hypertension.  Exercise Myoview stress test 03/29/2020: 1 Day Rest/Stress Protocol. Functional status: Good. Chest pain: Present, nonlimiting but relieved by sublingual nitro. Reason for stopping exercise: Fatigue. Hypertensive response to exercise: None. Patient exercised for 7 minutes and 33 seconds on Bruce protocol, achieved 9.42 METS, and 88% of APMHR.  Stress ECG positive for ischemia. No obvious evidence of reversible ischemia or prior infarct.  Calculated LVEF 47%, visually appears normal.  Left ventricular wall thickness is preserved without regional wall motion abnormalities. High risk study, due to chest pain with exercise relieved by sublingual nitro, stress ECG positive for ischemia with ectopy at peak exercise, mildly reduced LVEF per  gated SPECT.  Clinical correlation required.  EKG 03/16/2020: Sinus rhythm 73 bpm Nrmal EKG  EKG 03/13/2020: Sinus rhythm 75 bpm Normal EKG   Recent labs: 04/28/2020: Glucose 97, BUN/Cr 13/1.04. EGFR 74. Na/K 139/4.3.  Chol 115, TG 70, HDL 37, LDL 63  02/29/2020: Glucose 77, BUN/Cr 17/0.98. EGFR 76. Na/K 139/4.3. Rest of the CMP normal H/H 14/41. MCV 83. Platelets 284 HbA1C  5.7% Chol 227, TG 191, HDL 37, LDL 155 Testosterone 266 low    Review of Systems  Cardiovascular: Positive for chest pain. Negative for dyspnea on exertion, leg swelling, palpitations and syncope.         Vitals:   05/10/20 1321  BP: 138/74  Pulse: 73  Resp: 17  Temp: 98.2 F (36.8 C)  SpO2: 97%     Body mass index is 28.44 kg/m. Filed Weights   05/10/20 1321  Weight: 181 lb 9.6 oz (82.4 kg)     Objective:   Physical Exam Vitals and nursing note reviewed.  Constitutional:      General: He is not in acute distress. Neck:     Vascular: No JVD.  Cardiovascular:     Rate and Rhythm: Normal rate and regular rhythm.     Heart sounds: Normal heart sounds. No murmur heard.   Pulmonary:     Effort: Pulmonary effort is normal.     Breath sounds: Normal breath sounds. No wheezing or rales.  Musculoskeletal:     Right lower leg: No edema.     Left lower leg: No edema.         Assessment & Recommendations:   70 y.o. Caucasian male with hypertension, mixed hyperlipidemia, CAD with stable angina  CAD with stable angina: Abnormal stress test, coronary CTA showing high-grade stenosis in proximal LAD, CT FFR pending. Patient is currently experiencing occasional angina episodes with his regular physical activity. Continue aspirin, statin, metoprolol, amlodipine. With his limited physical activity, he is having limited angina symptoms.  On current medical therapy, he would like to hold off any further invasive management.  This is reasonable given his known left main coronary artery disease with stable angina.  However, I have cautioned him against heavy exertional physical activity.    Hypertension: Well-controlled  Mixed hyperlipidemia: LDL down from 155 to 63.  Continue Lipitor 20 mg daily.  F/u in 6 weeks   Nigel Mormon, MD Pager: [phone] Office: [phone]

## 2020-05-24 ENCOUNTER — Other Ambulatory Visit: Payer: Self-pay

## 2020-05-24 DIAGNOSIS — I208 Other forms of angina pectoris: Secondary | ICD-10-CM

## 2020-05-24 MED ORDER — ATORVASTATIN CALCIUM 20 MG PO TABS: 20.0000 mg | ORAL_TABLET | Freq: Every day | ORAL | 0 refills | Status: DC

## 2020-05-24 MED ORDER — METOPROLOL SUCCINATE ER 25 MG PO TB24: ORAL_TABLET | ORAL | 0 refills | Status: DC

## 2020-05-24 MED ORDER — AMLODIPINE BESYLATE 5 MG PO TABS: 5.0000 mg | ORAL_TABLET | Freq: Every day | ORAL | 0 refills | Status: DC

## 2020-06-15 ENCOUNTER — Ambulatory Visit: Payer: Medicare Other | Admitting: Cardiology

## 2020-07-10 ENCOUNTER — Ambulatory Visit: Payer: Medicare Other | Admitting: Cardiology

## 2020-07-10 DIAGNOSIS — Z79899 Other long term (current) drug therapy: Secondary | ICD-10-CM | POA: Diagnosis not present

## 2020-07-10 DIAGNOSIS — R011 Cardiac murmur, unspecified: Secondary | ICD-10-CM | POA: Diagnosis not present

## 2020-07-10 DIAGNOSIS — E7800 Pure hypercholesterolemia, unspecified: Secondary | ICD-10-CM | POA: Diagnosis not present

## 2020-07-10 DIAGNOSIS — E291 Testicular hypofunction: Secondary | ICD-10-CM | POA: Diagnosis not present

## 2020-07-10 DIAGNOSIS — E559 Vitamin D deficiency, unspecified: Secondary | ICD-10-CM | POA: Diagnosis not present

## 2020-07-10 DIAGNOSIS — I208 Other forms of angina pectoris: Secondary | ICD-10-CM | POA: Diagnosis not present

## 2020-07-10 DIAGNOSIS — R739 Hyperglycemia, unspecified: Secondary | ICD-10-CM | POA: Diagnosis not present

## 2020-07-13 DIAGNOSIS — E559 Vitamin D deficiency, unspecified: Secondary | ICD-10-CM | POA: Diagnosis not present

## 2020-07-13 DIAGNOSIS — R011 Cardiac murmur, unspecified: Secondary | ICD-10-CM | POA: Diagnosis not present

## 2020-07-13 DIAGNOSIS — Z79899 Other long term (current) drug therapy: Secondary | ICD-10-CM | POA: Diagnosis not present

## 2020-07-13 DIAGNOSIS — D649 Anemia, unspecified: Secondary | ICD-10-CM | POA: Diagnosis not present

## 2020-07-13 DIAGNOSIS — R7303 Prediabetes: Secondary | ICD-10-CM | POA: Diagnosis not present

## 2020-07-13 DIAGNOSIS — I493 Ventricular premature depolarization: Secondary | ICD-10-CM | POA: Diagnosis not present

## 2020-07-13 DIAGNOSIS — E291 Testicular hypofunction: Secondary | ICD-10-CM | POA: Diagnosis not present

## 2020-07-13 DIAGNOSIS — E7800 Pure hypercholesterolemia, unspecified: Secondary | ICD-10-CM | POA: Diagnosis not present

## 2020-07-13 DIAGNOSIS — I208 Other forms of angina pectoris: Secondary | ICD-10-CM | POA: Diagnosis not present

## 2020-07-14 ENCOUNTER — Other Ambulatory Visit: Payer: Self-pay | Admitting: Cardiology

## 2020-07-14 ENCOUNTER — Encounter: Payer: Self-pay | Admitting: Cardiology

## 2020-07-14 ENCOUNTER — Other Ambulatory Visit: Payer: Self-pay

## 2020-07-14 ENCOUNTER — Ambulatory Visit: Payer: Medicare Other | Admitting: Cardiology

## 2020-07-14 VITALS — BP 110/71 | HR 75 | Temp 98.3°F | Resp 17 | Ht 67.0 in | Wt 181.8 lb

## 2020-07-14 DIAGNOSIS — I208 Other forms of angina pectoris: Secondary | ICD-10-CM

## 2020-07-14 MED ORDER — METOPROLOL SUCCINATE ER 25 MG PO TB24: ORAL_TABLET | ORAL | 1 refills | Status: DC

## 2020-07-14 NOTE — Progress Notes (Signed)
Patient referred by [name], MD for chest pain  Subjective:   Charles Allen, male    DOB: 09-28-1950, 70 y.o.   MRN: [id number]   Chief Complaint  Patient presents with  . Chest Pain  . Hypertension    70 y.o. Caucasian male with mixed hyperlipidemia, stable angina  Patient underwent coronary CT angiogram in 04/2020 that showed high grade prox LAD stenosis. At that time, given improvement in angina symptoms, he wanted to continue medical management. However, he now reports that his angina frequency and severity has increased. He tells me that sometimes he feels like he is almost going to have a heart attack. He does admit that he has been out of metoprolol for a few days.        Current Outpatient Medications on File Prior to Visit  Medication Sig Dispense Refill  . amLODipine (NORVASC) 5 MG tablet Take 1 tablet (5 mg total) by mouth daily. 90 tablet 0  . ascorbic acid (VITAMIN C) 1000 MG tablet Take 1,000 mg by mouth daily.    [name] aspirin 81 MG chewable tablet Chew 81 mg by mouth daily.    [name] atorvastatin (LIPITOR) 20 MG tablet Take 1 tablet (20 mg total) by mouth daily. 90 tablet 0  . b complex vitamins capsule Take 1 capsule by mouth daily.    . cholecalciferol (VITAMIN D) 25 MCG (1000 UNIT) tablet Take 1,000 Units by mouth daily.    . diclofenac Sodium (VOLTAREN) 1 % GEL Apply topically See admin instructions.    . metoprolol succinate (TOPROL-XL) 25 MG 24 hr tablet TAKE 1 TABLET(25 MG) BY MOUTH DAILY WITH OR IMMEDIATELY FOLLOWING A MEAL 90 tablet 0  . nitroGLYCERIN (NITROSTAT) 0.3 MG SL tablet Place 1 tablet under the tongue as needed.    . Omega 3 1200 MG CAPS daily at 6 (six) AM.    . testosterone (ANDROGEL) 50 MG/5GM (1%) GEL as directed.    . Turmeric 500 MG CAPS Take 500 mg by mouth daily.     No current facility-administered medications on file prior to visit.    Cardiovascular and other pertinent studies:  EKG 07/14/2020: Sinus rhythm 70 bpm Normal  EKG  Coronary CTA 05/03/2020: 1. Coronary calcium score of 356. This was 60th percentile for age and sex matched control. 2. Normal coronary origin with left dominance. 3.  Ascending aortic atherosclerosis. 4.  Trileaflet aortic valve with valvular calcification. 5. CAD-RADS = 4. Mixed plaque in the proximal to mid LAD that accounts for moderate to severe stenosis. Moderate stenosis (50-69%) at the ostial segment of the first diagonal branch due to calcified plaque. Mild stenosis (25-49%) in the proximal to mid LCx due to calcified plaque. Mild stenosis (25-49%) in the proximal RCA due to mixed plaque. 6. Study is sent for CT-FFR to further evaluate the proximal to mid LAD stenosis. The findings will be performed and reported separately.  Echocardiogram 03/30/2020:  Left ventricle cavity is normal in size and wall thickness. Normal global  wall motion. Normal LV systolic function with EF 70%. Normal diastolic  filling pattern.  Structurally normal trileaflet aortic valve. Mild (Grade I) aortic  regurgitation.  Mild tricuspid regurgitation.  No evidence of pulmonary hypertension.  Exercise Myoview stress test 03/29/2020: 1 Day Rest/Stress Protocol. Functional status: Good. Chest pain: Present, nonlimiting but relieved by sublingual nitro. Reason for stopping exercise: Fatigue. Hypertensive response to exercise: None. Patient exercised for 7 minutes and 33 seconds on Bruce protocol, achieved 9.42 METS,  and 88% of APMHR.  Stress ECG positive for ischemia. No obvious evidence of reversible ischemia or prior infarct.  Calculated LVEF 47%, visually appears normal.  Left ventricular wall thickness is preserved without regional wall motion abnormalities. High risk study, due to chest pain with exercise relieved by sublingual nitro, stress ECG positive for ischemia with ectopy at peak exercise, mildly reduced LVEF per gated SPECT.  Clinical correlation required.  Recent  labs: 04/28/2020: Glucose 97, BUN/Cr 13/1.04. EGFR 74. Na/K 139/4.3.  Chol 115, TG 70, HDL 37, LDL 63  02/29/2020: Glucose 77, BUN/Cr 17/0.98. EGFR 76. Na/K 139/4.3. Rest of the CMP normal H/H 14/41. MCV 83. Platelets 284 HbA1C 5.7% Chol 227, TG 191, HDL 37, LDL 155 Testosterone 266 low    Review of Systems  Cardiovascular: Positive for chest pain. Negative for dyspnea on exertion, leg swelling, palpitations and syncope.         Vitals:   07/14/20 1255  BP: 110/71  Pulse: 75  Resp: 17  Temp: 98.3 F (36.8 C)  SpO2: 97%     Body mass index is 28.47 kg/m. Filed Weights   07/14/20 1255  Weight: 181 lb 12.8 oz (82.5 kg)     Objective:   Physical Exam Vitals and nursing note reviewed.  Constitutional:      General: He is not in acute distress. Neck:     Vascular: No JVD.  Cardiovascular:     Rate and Rhythm: Normal rate and regular rhythm.     Heart sounds: Normal heart sounds. No murmur heard.   Pulmonary:     Effort: Pulmonary effort is normal.     Breath sounds: Normal breath sounds. No wheezing or rales.  Musculoskeletal:     Right lower leg: No edema.     Left lower leg: No edema.         Assessment & Recommendations:   70 y.o. Caucasian male with hypertension, mixed hyperlipidemia, CAD with stable angina  CAD with stable angina: Abnormal stress test, coronary CTA showing high-grade stenosis in proximal LAD Now worsening angina symptoms.  Resume metoprolol succinate 25 mg daily.  Continue aspirin, statin, amlodipine. In addition, recommend coronary angiography, and possible intervention. Schedule for cardiac catheterization, and possible angioplasty. We discussed regarding risks, benefits, alternatives to this including stress testing, CTA and continued medical therapy. Patient wants to proceed. Understands <1-2% risk of death, stroke, MI, urgent CABG, bleeding, infection, renal failure but not limited to  these.  Hypertension: Well-controlled  Mixed hyperlipidemia: LDL down from 155 to 63.  Continue Lipitor 20 mg daily.  F/u after cath   Nigel Mormon, MD Pager: [phone] Office: [phone]

## 2020-07-19 DIAGNOSIS — I208 Other forms of angina pectoris: Secondary | ICD-10-CM | POA: Diagnosis not present

## 2020-07-19 DIAGNOSIS — D649 Anemia, unspecified: Secondary | ICD-10-CM | POA: Diagnosis not present

## 2020-07-20 LAB — CBC
Hematocrit: 37.4 % — ABNORMAL LOW (ref 37.5–51.0)
Hemoglobin: 12.4 g/dL — ABNORMAL LOW (ref 13.0–17.7)
MCH: 26.8 pg (ref 26.6–33.0)
MCHC: 33.2 g/dL (ref 31.5–35.7)
MCV: 81 fL (ref 79–97)
Platelets: 325 10*3/uL (ref 150–450)
RBC: 4.63 x10E6/uL (ref 4.14–5.80)
RDW: 13.5 % (ref 11.6–15.4)
WBC: 7.6 10*3/uL (ref 3.4–10.8)

## 2020-07-20 LAB — BASIC METABOLIC PANEL
BUN/Creatinine Ratio: 21 (ref 10–24)
BUN: 24 mg/dL (ref 8–27)
CO2: 26 mmol/L (ref 20–29)
Calcium: 9.4 mg/dL (ref 8.6–10.2)
Chloride: 102 mmol/L (ref 96–106)
Creatinine, Ser: 1.12 mg/dL (ref 0.76–1.27)
Glucose: 88 mg/dL (ref 65–99)
Potassium: 4.8 mmol/L (ref 3.5–5.2)
Sodium: 140 mmol/L (ref 134–144)
eGFR: 72 mL/min/{1.73_m2} (ref >59)

## 2020-07-21 ENCOUNTER — Other Ambulatory Visit (HOSPITAL_COMMUNITY)
Admission: RE | Admit: 2020-07-21 | Discharge: 2020-07-21 | Disposition: A | Discharge status: Home / Self Care | Payer: Medicare Other | Source: Ambulatory Visit | Attending: Cardiology | Admitting: Cardiology

## 2020-07-21 DIAGNOSIS — Z01812 Encounter for preprocedural laboratory examination: Secondary | ICD-10-CM | POA: Diagnosis not present

## 2020-07-21 DIAGNOSIS — Z20822 Contact with and (suspected) exposure to covid-19: Secondary | ICD-10-CM | POA: Diagnosis not present

## 2020-07-21 LAB — SARS CORONAVIRUS 2 (TAT 6-24 HRS): SARS Coronavirus 2: NEGATIVE

## 2020-07-24 ENCOUNTER — Ambulatory Visit: Payer: Medicare Other | Admitting: Cardiology

## 2020-07-25 ENCOUNTER — Other Ambulatory Visit: Payer: Self-pay

## 2020-07-25 ENCOUNTER — Encounter (HOSPITAL_COMMUNITY)
Admission: RE | Disposition: A | Discharge status: Home / Self Care | Payer: Self-pay | Source: Home / Self Care | Attending: Cardiology

## 2020-07-25 ENCOUNTER — Other Ambulatory Visit (HOSPITAL_COMMUNITY): Payer: Self-pay

## 2020-07-25 ENCOUNTER — Ambulatory Visit (HOSPITAL_COMMUNITY)
Admission: RE | Admit: 2020-07-25 | Discharge: 2020-07-25 | Disposition: A | Discharge status: Home / Self Care | Payer: Medicare Other | Attending: Cardiology | Admitting: Cardiology

## 2020-07-25 DIAGNOSIS — Z7982 Long term (current) use of aspirin: Secondary | ICD-10-CM | POA: Insufficient documentation

## 2020-07-25 DIAGNOSIS — I25118 Atherosclerotic heart disease of native coronary artery with other forms of angina pectoris: Secondary | ICD-10-CM | POA: Insufficient documentation

## 2020-07-25 DIAGNOSIS — I2089 Other forms of angina pectoris: Secondary | ICD-10-CM | POA: Diagnosis present

## 2020-07-25 DIAGNOSIS — I10 Essential (primary) hypertension: Secondary | ICD-10-CM | POA: Diagnosis present

## 2020-07-25 DIAGNOSIS — I208 Other forms of angina pectoris: Secondary | ICD-10-CM | POA: Diagnosis present

## 2020-07-25 DIAGNOSIS — E782 Mixed hyperlipidemia: Secondary | ICD-10-CM | POA: Insufficient documentation

## 2020-07-25 DIAGNOSIS — Z95820 Peripheral vascular angioplasty status with implants and grafts: Secondary | ICD-10-CM

## 2020-07-25 DIAGNOSIS — I25110 Atherosclerotic heart disease of native coronary artery with unstable angina pectoris: Secondary | ICD-10-CM | POA: Diagnosis not present

## 2020-07-25 DIAGNOSIS — Z79899 Other long term (current) drug therapy: Secondary | ICD-10-CM | POA: Insufficient documentation

## 2020-07-25 HISTORY — PX: LEFT HEART CATH AND CORONARY ANGIOGRAPHY: CATH118249

## 2020-07-25 HISTORY — PX: INTRAVASCULAR ULTRASOUND/IVUS: CATH118244

## 2020-07-25 HISTORY — PX: CORONARY STENT INTERVENTION: CATH118234

## 2020-07-25 LAB — POCT ACTIVATED CLOTTING TIME
Activated Clotting Time: 255 seconds
Activated Clotting Time: 267 seconds
Activated Clotting Time: 273 seconds
Activated Clotting Time: 273 seconds

## 2020-07-25 SURGERY — LEFT HEART CATH AND CORONARY ANGIOGRAPHY
Anesthesia: LOCAL

## 2020-07-25 MED ORDER — HEPARIN (PORCINE) IN NACL 1000-0.9 UT/500ML-% IV SOLN
INTRAVENOUS | Status: AC
  Filled 2020-07-25: qty 1500

## 2020-07-25 MED ORDER — SODIUM CHLORIDE 0.9 % IV SOLN
INTRAVENOUS | Status: AC | PRN
  Administered 2020-07-25: 83 mL/h via INTRAVENOUS

## 2020-07-25 MED ORDER — NITROGLYCERIN 1 MG/10 ML FOR IR/CATH LAB
INTRA_ARTERIAL | Status: DC | PRN
  Administered 2020-07-25 (×3): 200 ug via INTRACORONARY
  Administered 2020-07-25: 100 ug via INTRACORONARY

## 2020-07-25 MED ORDER — SODIUM CHLORIDE 0.9 % WEIGHT BASED INFUSION
3.0000 mL/kg/h | INTRAVENOUS | Status: AC
  Administered 2020-07-25: 3 mL/kg/h via INTRAVENOUS

## 2020-07-25 MED ORDER — SODIUM CHLORIDE 0.9 % IV SOLN: INTRAVENOUS | Status: AC

## 2020-07-25 MED ORDER — ACETAMINOPHEN 325 MG PO TABS: 650.0000 mg | ORAL_TABLET | ORAL | Status: DC | PRN

## 2020-07-25 MED ORDER — SODIUM CHLORIDE 0.9 % IV SOLN: 250.0000 mL | INTRAVENOUS | Status: DC | PRN

## 2020-07-25 MED ORDER — LABETALOL HCL 5 MG/ML IV SOLN: 10.0000 mg | INTRAVENOUS | Status: DC | PRN

## 2020-07-25 MED ORDER — FENTANYL CITRATE (PF) 100 MCG/2ML IJ SOLN
INTRAMUSCULAR | Status: AC
  Filled 2020-07-25: qty 2

## 2020-07-25 MED ORDER — SODIUM CHLORIDE 0.9% FLUSH: 3.0000 mL | Freq: Two times a day (BID) | INTRAVENOUS | Status: DC

## 2020-07-25 MED ORDER — VERAPAMIL HCL 2.5 MG/ML IV SOLN: INTRAVENOUS | Status: DC | PRN

## 2020-07-25 MED ORDER — HEPARIN SODIUM (PORCINE) 1000 UNIT/ML IJ SOLN
INTRAMUSCULAR | Status: AC
  Filled 2020-07-25: qty 1

## 2020-07-25 MED ORDER — CLOPIDOGREL BISULFATE 300 MG PO TABS
ORAL_TABLET | ORAL | Status: AC
  Filled 2020-07-25: qty 1

## 2020-07-25 MED ORDER — CLOPIDOGREL BISULFATE 75 MG PO TABS: 75.0000 mg | ORAL_TABLET | Freq: Every day | ORAL | Status: DC

## 2020-07-25 MED ORDER — ONDANSETRON HCL 4 MG/2ML IJ SOLN: 4.0000 mg | Freq: Four times a day (QID) | INTRAMUSCULAR | Status: DC | PRN

## 2020-07-25 MED ORDER — MIDAZOLAM HCL 2 MG/2ML IJ SOLN
INTRAMUSCULAR | Status: AC
  Filled 2020-07-25: qty 2

## 2020-07-25 MED ORDER — IOHEXOL 350 MG/ML SOLN
INTRAVENOUS | Status: DC | PRN
  Administered 2020-07-25: 120 mL

## 2020-07-25 MED ORDER — CLOPIDOGREL BISULFATE 75 MG PO TABS
75.0000 mg | ORAL_TABLET | Freq: Every day | ORAL | 1 refills | Status: DC
  Filled 2020-07-25: qty 30, 30d supply, fill #0

## 2020-07-25 MED ORDER — HEPARIN (PORCINE) IN NACL 1000-0.9 UT/500ML-% IV SOLN
INTRAVENOUS | Status: DC | PRN
  Administered 2020-07-25: 500 mL

## 2020-07-25 MED ORDER — NITROGLYCERIN 1 MG/10 ML FOR IR/CATH LAB
INTRA_ARTERIAL | Status: AC
  Filled 2020-07-25: qty 10

## 2020-07-25 MED ORDER — LIDOCAINE HCL (PF) 1 % IJ SOLN
INTRAMUSCULAR | Status: AC
  Filled 2020-07-25: qty 30

## 2020-07-25 MED ORDER — SODIUM CHLORIDE 0.9% FLUSH: 3.0000 mL | INTRAVENOUS | Status: DC | PRN

## 2020-07-25 MED ORDER — HEPARIN SODIUM (PORCINE) 1000 UNIT/ML IJ SOLN
INTRAMUSCULAR | Status: DC | PRN
  Administered 2020-07-25: 2000 [IU] via INTRAVENOUS
  Administered 2020-07-25: 4000 [IU] via INTRAVENOUS
  Administered 2020-07-25: 2000 [IU] via INTRAVENOUS

## 2020-07-25 MED ORDER — ASPIRIN 81 MG PO CHEW
81.0000 mg | CHEWABLE_TABLET | ORAL | Status: AC
  Administered 2020-07-25: 81 mg via ORAL
  Filled 2020-07-25: qty 1

## 2020-07-25 MED ORDER — CLOPIDOGREL BISULFATE 300 MG PO TABS
ORAL_TABLET | ORAL | Status: DC | PRN
  Administered 2020-07-25: 600 mg via ORAL

## 2020-07-25 MED ORDER — MIDAZOLAM HCL 2 MG/2ML IJ SOLN
INTRAMUSCULAR | Status: DC | PRN
  Administered 2020-07-25: 1 mg via INTRAVENOUS

## 2020-07-25 MED ORDER — FENTANYL CITRATE (PF) 100 MCG/2ML IJ SOLN
INTRAMUSCULAR | Status: DC | PRN
  Administered 2020-07-25: 50 ug via INTRAVENOUS

## 2020-07-25 MED ORDER — SODIUM CHLORIDE 0.9 % WEIGHT BASED INFUSION
1.0000 mL/kg/h | INTRAVENOUS | Status: DC
  Administered 2020-07-25 (×2): 250 mL via INTRAVENOUS

## 2020-07-25 MED ORDER — HYDRALAZINE HCL 20 MG/ML IJ SOLN: 10.0000 mg | INTRAMUSCULAR | Status: DC | PRN

## 2020-07-25 MED ORDER — LIDOCAINE HCL (PF) 1 % IJ SOLN
INTRAMUSCULAR | Status: DC | PRN
  Administered 2020-07-25: 3 mL

## 2020-07-25 MED ORDER — HEPARIN SODIUM (PORCINE) 1000 UNIT/ML IJ SOLN
INTRAMUSCULAR | Status: DC | PRN
  Administered 2020-07-25: 3000 [IU] via INTRAVENOUS
  Administered 2020-07-25: 4000 [IU] via INTRAVENOUS

## 2020-07-25 MED ORDER — VERAPAMIL HCL 2.5 MG/ML IV SOLN
INTRAVENOUS | Status: AC
  Filled 2020-07-25: qty 2

## 2020-07-25 SURGICAL SUPPLY — 30 items
BALLN  ~~LOC~~ SAPPHIRE 4.5X12 (BALLOONS) ×1
BALLN SAPPHIRE 2.5X15 (BALLOONS) ×2
BALLN SAPPHIRE 3.0X15 (BALLOONS) ×2
BALLN SAPPHIRE ~~LOC~~ 3.0X8 (BALLOONS) ×2 IMPLANT
BALLN SAPPHIRE ~~LOC~~ 3.5X15 (BALLOONS) ×2 IMPLANT
BALLN WOLVERINE 3.00X10 (BALLOONS) ×2
BALLN ~~LOC~~ SAPPHIRE 4.5X12 (BALLOONS) ×1
BALLOON SAPPHIRE 2.5X15 (BALLOONS) ×1 IMPLANT
BALLOON SAPPHIRE 3.0X15 (BALLOONS) ×1 IMPLANT
BALLOON WOLVERINE 3.00X10 (BALLOONS) ×1 IMPLANT
BALLOON ~~LOC~~ SAPPHIRE 4.5X12 (BALLOONS) ×1 IMPLANT
CATH 5FR JL3.5 JR4 ANG PIG MP (CATHETERS) ×2 IMPLANT
CATH LAUNCHER 6FR EBU 3 (CATHETERS) ×2 IMPLANT
CATH OPTICROSS HD (CATHETERS) ×2 IMPLANT
DEVICE RAD COMP TR BAND LRG (VASCULAR PRODUCTS) ×2 IMPLANT
GLIDESHEATH SLEND A-KIT 6F 22G (SHEATH) ×2 IMPLANT
GUIDEWIRE INQWIRE 1.5J.035X260 (WIRE) ×1 IMPLANT
INQWIRE 1.5J .035X260CM (WIRE) ×2
KIT ENCORE 26 ADVANTAGE (KITS) ×2 IMPLANT
KIT HEART LEFT (KITS) ×2 IMPLANT
PACK CARDIAC CATHETERIZATION (CUSTOM PROCEDURE TRAY) ×2 IMPLANT
SLED PULL BACK IVUS (MISCELLANEOUS) ×2 IMPLANT
STENT SYNERGY XD 3.0X38 (Permanent Stent) ×1 IMPLANT
STENT SYNERGY XD 4.0X16 (Permanent Stent) ×1 IMPLANT
SYNERGY XD 3.0X38 (Permanent Stent) ×2 IMPLANT
SYNERGY XD 4.0X16 (Permanent Stent) ×2 IMPLANT
TRANSDUCER W/STOPCOCK (MISCELLANEOUS) ×2 IMPLANT
TUBING CIL FLEX 10 FLL-RA (TUBING) ×2 IMPLANT
WIRE ASAHI PROWATER 180CM (WIRE) ×2 IMPLANT
WIRE RUNTHROUGH .014X180CM (WIRE) ×4 IMPLANT

## 2020-07-25 NOTE — CV Procedure (Signed)
IVUC guided complex PCI to ostial to mid LAD Overlapping stents Synergy DES 3.0X38 mm & 4.0X16 mm Full note to follow.   Nigel Mormon, MD Pager: [phone] Office: [phone]

## 2020-07-25 NOTE — Progress Notes (Addendum)
Ambulated to the bathroom to void tol well. Discharge instructions reviewed with pt and his wife both voice understanding.

## 2020-07-25 NOTE — H&P (Signed)
OV 07/14/2020 copied for documentation     Subjective:   Charles Allen, male    DOB: [birth year], 70 y.o.   MRN: [id number]  C/C: Chest pain  71 y.o. Caucasian male with mixed hyperlipidemia, stable angina  Patient underwent coronary CT angiogram in 04/2020 that showed high grade prox LAD stenosis. At that time, given improvement in angina symptoms, he wanted to continue medical management. However, he now reports that his angina frequency and severity has increased. He tells me that sometimes he feels like he is almost going to have a heart attack. He does admit that he has been out of metoprolol for a few days.        No current facility-administered medications on file prior to encounter.   Current Outpatient Medications on File Prior to Encounter  Medication Sig Dispense Refill  . amLODipine (NORVASC) 5 MG tablet Take 1 tablet (5 mg total) by mouth daily. 90 tablet 0  . ascorbic acid (VITAMIN C) 1000 MG tablet Take 1,000 mg by mouth daily.    [name] aspirin 81 MG chewable tablet Chew 81 mg by mouth daily.    [name] atorvastatin (LIPITOR) 20 MG tablet Take 1 tablet (20 mg total) by mouth daily. 90 tablet 0  . b complex vitamins capsule Take 1 capsule by mouth daily.    . Cholecalciferol (VITAMIN D3) 125 MCG (5000 UT) TABS Take 5,000 Units by mouth daily.    . diclofenac Sodium (VOLTAREN) 1 % GEL Apply 2 g topically daily as needed (pain).    . nitroGLYCERIN (NITROSTAT) 0.3 MG SL tablet Place 0.3 mg under the tongue every 5 (five) minutes as needed for chest pain.    . Omega-3 Fatty Acids (OMEGA 3 PO) Take 1 capsule by mouth daily at 6 (six) AM.    . testosterone (ANDROGEL) 50 MG/5GM (1%) GEL Place 5 g onto the skin daily. Apply to both shoulders once a day.    . Turmeric 500 MG CAPS Take 1,000 mg by mouth daily.      Cardiovascular and other pertinent studies:  EKG 07/14/2020: Sinus rhythm 70 bpm Normal EKG  Coronary CTA 05/03/2020: 1. Coronary calcium score of 356. This was 63th  percentile for age and sex matched control. 2. Normal coronary origin with left dominance. 3.  Ascending aortic atherosclerosis. 4.  Trileaflet aortic valve with valvular calcification. 5. CAD-RADS = 4. Mixed plaque in the proximal to mid LAD that accounts for moderate to severe stenosis. Moderate stenosis (50-69%) at the ostial segment of the first diagonal branch due to calcified plaque. Mild stenosis (25-49%) in the proximal to mid LCx due to calcified plaque. Mild stenosis (25-49%) in the proximal RCA due to mixed plaque. 6. Study is sent for CT-FFR to further evaluate the proximal to mid LAD stenosis. The findings will be performed and reported separately.  Echocardiogram 03/30/2020:  Left ventricle cavity is normal in size and wall thickness. Normal global  wall motion. Normal LV systolic function with EF 70%. Normal diastolic  filling pattern.  Structurally normal trileaflet aortic valve. Mild (Grade I) aortic  regurgitation.  Mild tricuspid regurgitation.  No evidence of pulmonary hypertension.  Exercise Myoview stress test 03/29/2020: 1 Day Rest/Stress Protocol. Functional status: Good. Chest pain: Present, nonlimiting but relieved by sublingual nitro. Reason for stopping exercise: Fatigue. Hypertensive response to exercise: None. Patient exercised for 7 minutes and 33 seconds on Bruce protocol, achieved 9.42 METS, and 88% of APMHR.  Stress ECG positive for ischemia. No obvious evidence of reversible  ischemia or prior infarct.  Calculated LVEF 47%, visually appears normal.  Left ventricular wall thickness is preserved without regional wall motion abnormalities. High risk study, due to chest pain with exercise relieved by sublingual nitro, stress ECG positive for ischemia with ectopy at peak exercise, mildly reduced LVEF per gated SPECT.  Clinical correlation required.  Recent labs: 04/28/2020: Glucose 97, BUN/Cr 13/1.04. EGFR 74. Na/K 139/4.3.  Chol 115, TG 70, HDL 37,  LDL 63  02/29/2020: Glucose 77, BUN/Cr 17/0.98. EGFR 76. Na/K 139/4.3. Rest of the CMP normal H/H 14/41. MCV 83. Platelets 284 HbA1C 5.7% Chol 227, TG 191, HDL 37, LDL 155 Testosterone 266 low    Review of Systems  Cardiovascular: Positive for chest pain. Negative for dyspnea on exertion, leg swelling, palpitations and syncope.         Vitals:   07/25/20 0833  BP: 136/76  Pulse: 67  Temp: 98.3 F (36.8 C)  SpO2: 100%     Body mass index is 29.05 kg/m. Filed Weights   07/25/20 0833  Weight: 81.6 kg     Objective:   Physical Exam Vitals and nursing note reviewed.  Constitutional:      General: He is not in acute distress. Neck:     Vascular: No JVD.  Cardiovascular:     Rate and Rhythm: Normal rate and regular rhythm.     Heart sounds: Normal heart sounds. No murmur heard.   Pulmonary:     Effort: Pulmonary effort is normal.     Breath sounds: Normal breath sounds. No wheezing or rales.  Musculoskeletal:     Right lower leg: No edema.     Left lower leg: No edema.         Assessment & Recommendations:   70 y.o. Caucasian male with hypertension, mixed hyperlipidemia, CAD with stable angina  CAD with stable angina: Abnormal stress test, coronary CTA showing high-grade stenosis in proximal LAD Now worsening angina symptoms.  Resume metoprolol succinate 25 mg daily.  Continue aspirin, statin, amlodipine. In addition, recommend coronary angiography, and possible intervention. Schedule for cardiac catheterization, and possible angioplasty. We discussed regarding risks, benefits, alternatives to this including stress testing, CTA and continued medical therapy. Patient wants to proceed. Understands <1-2% risk of death, stroke, MI, urgent CABG, bleeding, infection, renal failure but not limited to these.  Hypertension: Well-controlled  Mixed hyperlipidemia: LDL down from 155 to 63.  Continue Lipitor 20 mg daily.  F/u after cath   Nigel Mormon, MD Pager: [phone] Office: [phone]

## 2020-07-25 NOTE — Progress Notes (Signed)
[id number] Education completed with pt and wife who voiced understanding. Stressed importance of plavix with stent. Reviewed NTG use, heart healthy food choices, walking for ex, and CRP 2. Pt interested in attending. Referred to [place]. Graylon Good RN BSN 07/25/2020 3:26 PM

## 2020-07-25 NOTE — Interval H&P Note (Signed)
History and Physical Interval Note:  07/25/2020 10:38 AM  Charles Allen  has presented today for surgery, with the diagnosis of chest pain, HTN.  The various methods of treatment have been discussed with the patient and family. After consideration of risks, benefits and other options for treatment, the patient has consented to  Procedure(s): LEFT HEART CATH AND CORONARY ANGIOGRAPHY (N/A) as a surgical intervention.  The patient's history has been reviewed, patient examined, no change in status, stable for surgery.  I have reviewed the patient's chart and labs.  Questions were answered to the patient's satisfaction.    2016/2017 Appropriate Use Criteria for Coronary Revascularization Clinical Presentation: Diabetes Mellitus? Symptom Status? S/P CABG? Antianginal Therapy (# of long-acting drugs)? Results of Non-invasive testing? FFR/iFR results in all diseased vessels? Patient undergoing renal transplant? Patient undergoing percutaneous valve procedure (TAVR, MitraClip, Others)? Symptom Status:  Ischemic Symptoms  Non-invasive Testing:  High risk  If no or indeterminate stress test, FFR/iFR results in all diseased vessels:  N/A  Diabetes Mellitus:  No  S/P CABG:  No  Antianginal therapy (number of long-acting drugs):  >=2  Patient undergoing renal transplant:  No  Patient undergoing percutaneous valve procedure:  No    newline 1 Vessel Disease PCI CABG  No proximal LAD involvement, No proximal left dominant LCX involvement A (8); Indication 2 M (6); Indication 2   Proximal left dominant LCX involvement A (8); Indication 5 A (8); Indication 5   Proximal LAD involvement A (8); Indication 5 A (8); Indication 5   newline 2 Vessel Disease  No proximal LAD involvement A (8); Indication 8 A (7); Indication 8   Proximal LAD involvement A (8); Indication 11 A (8); Indication 11   newline 3 Vessel Disease  Low disease complexity (e.g., focal stenoses, SYNTAX <=22) A (8); Indication 17 A (8);  Indication 17   Intermediate or high disease complexity (e.g., SYNTAX >=23) M (6); Indication 21 A (9); Indication 21   newline Left Main Disease  Isolated LMCA disease: ostial or midshaft A (7); Indication 24 A (9); Indication 24   Isolated LMCA disease: bifurcation involvement M (6); Indication 25 A (9); Indication 25   LMCA ostial or midshaft, concurrent low disease burden multivessel disease (e.g., 1-2 additional focal stenoses, SYNTAX <=22) A (7); Indication 26 A (9); Indication 26   LMCA ostial or midshaft, concurrent intermediate or high disease burden multivessel disease (e.g., 1-2 additional bifurcation stenoses, long stenoses, SYNTAX >=23) M (4); Indication 27 A (9); Indication 27   LMCA bifurcation involvement, concurrent low disease burden multivessel disease (e.g., 1-2 additional focal stenoses, SYNTAX <=22) M (6); Indication 28 A (9); Indication 28   LMCA bifurcation involvement, concurrent intermediate or high disease burden multivessel disease (e.g., 1-2 additional bifurcation stenoses, long stenoses, SYNTAX >=23) R (3); Indication 29 A (9); Indication 29     Notes: A indicates appropriate. M indicates may be appropriate. R indicates rarely appropriate. Number in parentheses is median score for that indication. Reclassify indicates number of functionally diseased vessels should be decreased given negative FFR/iFR. Re-evaluate the scenario interpreting any FFR/iFR negative vessel as being not significantly stenosed. Disease means involved vessel provides flow to a sufficient amount of myocardium to be clinically important. If FFR testing indicates a vessel is not significant, that vessel should not be considered diseased (and the patient should be reclassified with respect to extent of functionally significant disease). Proximal LAD + proximal left dominant LCX is considered 3 vessel CAD 2 Vessel CAD with  FFR/iFR abnormal in only 1 but not both is considered 1 vessel CAD Disease  complexity includes occlusion, bifurcation, trifurcation, ostial, >20 mm, tortuosity, calcification, thrombus LMCA disease is >=50% by angiography, MLD <2.8 mm, MLA <6 mm2; MLA 6-7.5 mm2 requires further physiologic See Table B for risk stratification based on noninvasive testing   Journal of the [employer] of Cardiology Mar 2017, 23391; DOI: 10.1016/j.jacc.2017.02.001 [email or web address].full-text.pdf   [employer]

## 2020-07-26 ENCOUNTER — Encounter (HOSPITAL_COMMUNITY): Payer: Self-pay | Admitting: Cardiology

## 2020-07-26 ENCOUNTER — Telehealth (HOSPITAL_COMMUNITY): Payer: Self-pay

## 2020-07-26 NOTE — Telephone Encounter (Signed)
Spoke to pt regarding adherence. Prior issues stemmed from travelling and lack of refills. Has maintained adherence since. Didn't have any questions.

## 2020-08-03 DIAGNOSIS — D500 Iron deficiency anemia secondary to blood loss (chronic): Secondary | ICD-10-CM | POA: Diagnosis not present

## 2020-08-03 DIAGNOSIS — I2510 Atherosclerotic heart disease of native coronary artery without angina pectoris: Secondary | ICD-10-CM | POA: Diagnosis not present

## 2020-08-11 ENCOUNTER — Telehealth: Payer: Self-pay

## 2020-08-11 DIAGNOSIS — D509 Iron deficiency anemia, unspecified: Secondary | ICD-10-CM | POA: Diagnosis not present

## 2020-08-11 DIAGNOSIS — Z006 Encounter for examination for normal comparison and control in clinical research program: Secondary | ICD-10-CM

## 2020-08-11 NOTE — Telephone Encounter (Signed)
I have attempted without success to contact this patient by phone for his Identify 90 day follow up phone call. I left a message for patient to return my phone call with my name and callback number. An e-mail was also sent to patient.   

## 2020-08-15 DIAGNOSIS — I2510 Atherosclerotic heart disease of native coronary artery without angina pectoris: Secondary | ICD-10-CM | POA: Insufficient documentation

## 2020-08-15 NOTE — Progress Notes (Signed)
Patient referred by Charles Shanks, MD for chest pain  Subjective:   Charles Allen, male    DOB: 1951/02/11, 70 y.o.   MRN: [id number]   Chief Complaint  Patient presents with  . post cath  . Follow-up    Per MP    70 y.o. Caucasian male with mixed hyperlipidemia, CAD  Patient had successful PCI to LAD on 07/25/2020. He has had significant improvement in angina symptoms, but he has still has symptoms of chest pain after walking for 10 min.Charles Allen  He also reports episodes of lethargy on certain days.  Concurrently, he is being treated for iron deficiency anemia by [facility] gastroenterology, sees Vicie Mutters, PA-C.  He denies any melena or hematochezia.  There has been discussion regarding colonoscopy.  Current Outpatient Medications on File Prior to Visit  Medication Sig Dispense Refill  . amLODipine (NORVASC) 5 MG tablet Take 1 tablet (5 mg total) by mouth daily. 90 tablet 0  . ascorbic acid (VITAMIN C) 1000 MG tablet Take 1,000 mg by mouth daily.    Charles Allen aspirin 81 MG chewable tablet Chew 81 mg by mouth daily.    Charles Allen atorvastatin (LIPITOR) 20 MG tablet Take 1 tablet (20 mg total) by mouth daily. 90 tablet 0  . b complex vitamins capsule Take 1 capsule by mouth daily.    . Cholecalciferol (VITAMIN D3) 125 MCG (5000 UT) TABS Take 5,000 Units by mouth daily.    . clopidogrel (PLAVIX) 75 MG tablet Take 1 tablet (75 mg total) by mouth daily. 30 tablet 1  . Ferrous Sulfate (IRON) 325 (65 Fe) MG TABS Take 1 tablet by mouth daily.    . metoprolol succinate (TOPROL-XL) 25 MG 24 hr tablet TAKE 1 TABLET(25 MG) BY MOUTH DAILY WITH OR IMMEDIATELY FOLLOWING A MEAL (Patient taking differently: Take 25 mg by mouth daily. TAKE 1 TABLET(25 MG) BY MOUTH DAILY WITH OR IMMEDIATELY FOLLOWING A MEAL) 90 tablet 0  . nitroGLYCERIN (NITROSTAT) 0.3 MG SL tablet Place 0.3 mg under the tongue every 5 (five) minutes as needed for chest pain.    . Omega-3 Fatty Acids (OMEGA 3 PO) Take 1 capsule by mouth daily at 6 (six)  AM.    . testosterone (ANDROGEL) 50 MG/5GM (1%) GEL Place 5 g onto the skin daily. Apply to both shoulders once a day.    . Turmeric 500 MG CAPS Take 1,000 mg by mouth daily.     No current facility-administered medications on file prior to visit.    Cardiovascular and other pertinent studies:  EKG 08/16/2020: Sinus rhythm 68 bpm  Normal EKG  Coronary intervention 07/25/2020: LM: Normal LAD: Ostial 40-50% disease, followed by 95% proximal LAD stenosis, followed by diffuse 60% disease of the mid LAD LCx: Large dominant vessel, no significant disease RCA: Small nondominant vessel, no significant disease       Synergy DES 3.0X38 mm, post-dilated with 3.5 mm [place] balloon     Synergy DES 4.0X16 mm, post-dilated with 4.5 mm [place] balloon  LAD stenosis 95%-->0% TIMI flow I-->III  Coronary CTA 05/03/2020: 1. Coronary calcium score of 356. This was 58th percentile for age and sex matched control. 2. Normal coronary origin with left dominance. 3.  Ascending aortic atherosclerosis. 4.  Trileaflet aortic valve with valvular calcification. 5. CAD-RADS = 4. Mixed plaque in the proximal to mid LAD that accounts for moderate to severe stenosis. Moderate stenosis (50-69%) at the ostial segment of the first diagonal branch due to calcified plaque. Mild stenosis (  25-49%) in the proximal to mid LCx due to calcified plaque. Mild stenosis (25-49%) in the proximal RCA due to mixed plaque. 6. Study is sent for CT-FFR to further evaluate the proximal to mid LAD stenosis. The findings will be performed and reported separately.  Echocardiogram 03/30/2020:  Left ventricle cavity is normal in size and wall thickness. Normal global  wall motion. Normal LV systolic function with EF 70%. Normal diastolic  filling pattern.  Structurally normal trileaflet aortic valve. Mild (Grade I) aortic  regurgitation.  Mild tricuspid regurgitation.  No evidence of pulmonary hypertension.  Exercise Myoview stress test  03/29/2020: 1 Day Rest/Stress Protocol. Functional status: Good. Chest pain: Present, nonlimiting but relieved by sublingual nitro. Reason for stopping exercise: Fatigue. Hypertensive response to exercise: None. Patient exercised for 7 minutes and 33 seconds on Bruce protocol, achieved 9.42 METS, and 88% of APMHR.  Stress ECG positive for ischemia. No obvious evidence of reversible ischemia or prior infarct.  Calculated LVEF 47%, visually appears normal.  Left ventricular wall thickness is preserved without regional wall motion abnormalities. High risk study, due to chest pain with exercise relieved by sublingual nitro, stress ECG positive for ischemia with ectopy at peak exercise, mildly reduced LVEF per gated SPECT.  Clinical correlation required.  Recent labs: 07/19/2020: Glucose 88, BUN/Cr 24/1.12. EGFR 72. Na/K 140/4.8.  H/H 12/37. MCV 81. Platelets 325  04/28/2020: Glucose 97, BUN/Cr 13/1.04. EGFR 74. Na/K 139/4.3.  Chol 115, TG 70, HDL 37, LDL 63  02/29/2020: Glucose 77, BUN/Cr 17/0.98. EGFR 76. Na/K 139/4.3. Rest of the CMP normal H/H 14/41. MCV 83. Platelets 284 HbA1C 5.7% Chol 227, TG 191, HDL 37, LDL 155 Testosterone 266 low    Review of Systems  Constitutional: Positive for malaise/fatigue.  Cardiovascular: Positive for chest pain. Negative for dyspnea on exertion, leg swelling, palpitations and syncope.        Vitals:   08/16/20 0935  BP: 114/67  Pulse: 74  Resp: 17  Temp: 98.1 F (36.7 C)  SpO2: 98%     Body mass index is 28.89 kg/m. Filed Weights   08/16/20 0935  Weight: 179 lb (81.2 kg)     Objective:   Physical Exam Vitals and nursing note reviewed.  Constitutional:      General: He is not in acute distress. Neck:     Vascular: No JVD.  Cardiovascular:     Rate and Rhythm: Normal rate and regular rhythm.     Heart sounds: Normal heart sounds. No murmur heard.   Pulmonary:     Effort: Pulmonary effort is normal.     Breath sounds:  Normal breath sounds. No wheezing or rales.  Musculoskeletal:     Right lower leg: No edema.     Left lower leg: No edema.         Assessment & Recommendations:   70 y.o. Caucasian male with hypertension, mixed hyperlipidemia, CAD with stable angina  CAD with stable angina: S/p LAD PCI (07/25/2020) Still has residual chest pain symptoms, although significantly improved compared to prior. I wonder if his ostial diagonal moderate lesion is physiologically significant. Other etiology for chest pain could very well be anemia. I recommend continued treatment for iron deficiency anemia.  Added isosorbide mononitrate 30 mg daily.  If anginal symptoms persist in spite of treatment of an deficiency anemia, and antianginal therapy, could consider repeat coronary angiogram in physiologically guided stenting to diagonal artery. Recommend DPAT with Aspirin and Plavix at least till 01/2021 Continue metoprolol succinate 25 mg  daily, lipitor 20 mg daily, amlodipine 5 mg daily Given his recent stenting, I recommend delaying colonoscopy until 01/2021, unless needed sooner for clinical indication.  Hypertension: Well-controlled  Mixed hyperlipidemia: LDL down from 155 to 63.  Continue Lipitor 20 mg daily.  F/u in 4 weeks   Nigel Mormon, MD Pager: [phone] Office: [phone]

## 2020-08-16 ENCOUNTER — Encounter: Payer: Self-pay | Admitting: Cardiology

## 2020-08-16 ENCOUNTER — Other Ambulatory Visit: Payer: Self-pay

## 2020-08-16 ENCOUNTER — Ambulatory Visit: Payer: Medicare Other | Admitting: Cardiology

## 2020-08-16 VITALS — BP 114/67 | HR 74 | Temp 98.1°F | Resp 17 | Ht 66.0 in | Wt 179.0 lb

## 2020-08-16 DIAGNOSIS — I25118 Atherosclerotic heart disease of native coronary artery with other forms of angina pectoris: Secondary | ICD-10-CM | POA: Diagnosis not present

## 2020-08-16 DIAGNOSIS — I208 Other forms of angina pectoris: Secondary | ICD-10-CM

## 2020-08-16 MED ORDER — ISOSORBIDE MONONITRATE ER 30 MG PO TB24
30.0000 mg | ORAL_TABLET | Freq: Every day | ORAL | 3 refills | Status: DC
Start: 2020-08-16 — End: 2020-10-13

## 2020-08-16 MED ORDER — ASPIRIN 81 MG PO CHEW: 81.0000 mg | CHEWABLE_TABLET | Freq: Every day | ORAL | 3 refills | Status: AC

## 2020-08-16 MED ORDER — METOPROLOL SUCCINATE ER 25 MG PO TB24: 25.0000 mg | ORAL_TABLET | Freq: Every day | ORAL | 3 refills | Status: DC

## 2020-08-16 MED ORDER — ATORVASTATIN CALCIUM 20 MG PO TABS: 20.0000 mg | ORAL_TABLET | Freq: Every day | ORAL | 3 refills | Status: DC

## 2020-08-16 MED ORDER — CLOPIDOGREL BISULFATE 75 MG PO TABS: 75.0000 mg | ORAL_TABLET | Freq: Every day | ORAL | 3 refills | Status: DC

## 2020-08-22 ENCOUNTER — Telehealth: Payer: Self-pay

## 2020-08-22 NOTE — Telephone Encounter (Signed)
This is likely side effect of the medication Isosorbide mononitrate (Imdur). Recommend taking scheduled tylenol 500 mg 2-3 times/day for next few days. If still not able to tolerate, may need to stoop Isosorbide mononitrate.   Thanks MJP

## 2020-08-23 NOTE — Telephone Encounter (Signed)
Called pt no answer, left a VM

## 2020-08-23 NOTE — Telephone Encounter (Signed)
Called patient, NA, LMAM

## 2020-08-24 NOTE — Telephone Encounter (Signed)
Called and spoke to pt regarding medication. Pt stated he will try the tylenol and call us back in a few days to let us know how he is doing.

## 2020-09-07 DIAGNOSIS — D509 Iron deficiency anemia, unspecified: Secondary | ICD-10-CM | POA: Diagnosis not present

## 2020-09-13 NOTE — Progress Notes (Signed)
Patient referred by [name], MD for chest pain  Subjective:   Charles Allen, male    DOB: 1950-06-07, 70 y.o.   MRN: [id number]   Chief Complaint  Patient presents with   Coronary Artery Disease   Stable angina pectoris    Follow-up    4 week    70 y.o. Caucasian male with mixed hyperlipidemia, CAD  Patient had successful PCI to LAD on 07/25/2020. Chest pain has improved. He is tolerating metoprolol and Imdur well. He has had episodes of sudden heart rate increase, lasting for a few min.  On a separate note, he is being treated for iron deficiency anemia by [facility] gastroenterology, sees Vicie Mutters, PA-C.  He denies any melena or hematochezia.  There has been discussion regarding colonoscopy.  Current Outpatient Medications on File Prior to Visit  Medication Sig Dispense Refill   amLODipine (NORVASC) 5 MG tablet Take 1 tablet (5 mg total) by mouth daily. 90 tablet 0   ascorbic acid (VITAMIN C) 1000 MG tablet Take 1,000 mg by mouth daily.     aspirin 81 MG chewable tablet Chew 1 tablet (81 mg total) by mouth daily. 90 tablet 3   atorvastatin (LIPITOR) 20 MG tablet Take 1 tablet (20 mg total) by mouth daily. 90 tablet 3   b complex vitamins capsule Take 1 capsule by mouth daily.     Cholecalciferol (VITAMIN D3) 125 MCG (5000 UT) TABS Take 5,000 Units by mouth daily.     clopidogrel (PLAVIX) 75 MG tablet Take 1 tablet (75 mg total) by mouth daily. 90 tablet 3   Ferrous Sulfate (IRON) 325 (65 Fe) MG TABS Take 1 tablet by mouth daily.     isosorbide mononitrate (IMDUR) 30 MG 24 hr tablet Take 1 tablet (30 mg total) by mouth daily. 30 tablet 3   metoprolol succinate (TOPROL-XL) 25 MG 24 hr tablet Take 1 tablet (25 mg total) by mouth daily. TAKE 1 TABLET(25 MG) BY MOUTH DAILY WITH OR IMMEDIATELY FOLLOWING A MEAL 90 tablet 3   nitroGLYCERIN (NITROSTAT) 0.3 MG SL tablet Place 0.3 mg under the tongue every 5 (five) minutes as needed for chest pain.     Omega-3 Fatty Acids (OMEGA 3  PO) Take 1 capsule by mouth daily at 6 (six) AM.     testosterone (ANDROGEL) 50 MG/5GM (1%) GEL Place 5 g onto the skin daily. Apply to both shoulders once a day.     Turmeric 500 MG CAPS Take 1,000 mg by mouth daily.     No current facility-administered medications on file prior to visit.    Cardiovascular and other pertinent studies:  EKG 08/16/2020: Sinus rhythm 68 bpm  Normal EKG  Coronary intervention 07/25/2020: LM: Normal LAD: Ostial 40-50% disease, followed by 95% proximal LAD stenosis, followed by diffuse 60% disease of the mid LAD LCx: Large dominant vessel, no significant disease RCA: Small nondominant vessel, no significant disease        Synergy DES 3.0X38 mm, post-dilated with 3.5 mm [place] balloon     Synergy DES 4.0X16 mm, post-dilated with 4.5 mm [place] balloon   LAD stenosis 95%-->0% TIMI flow I-->III  Coronary CTA 05/03/2020: 1. Coronary calcium score of 356. This was 39th percentile for age and sex matched control. 2. Normal coronary origin with left dominance. 3.  Ascending aortic atherosclerosis. 4.  Trileaflet aortic valve with valvular calcification. 5. CAD-RADS = 4. Mixed plaque in the proximal to mid LAD that accounts for moderate to severe stenosis.  Moderate stenosis (50-69%) at the ostial segment of the first diagonal branch due to calcified plaque. Mild stenosis (25-49%) in the proximal to mid LCx due to calcified plaque. Mild stenosis (25-49%) in the proximal RCA due to mixed plaque. 6. Study is sent for CT-FFR to further evaluate the proximal to mid LAD stenosis. The findings will be performed and reported separately.  Echocardiogram 03/30/2020:  Left ventricle cavity is normal in size and wall thickness. Normal global  wall motion. Normal LV systolic function with EF 70%. Normal diastolic  filling pattern.  Structurally normal trileaflet aortic valve.  Mild (Grade I) aortic  regurgitation.  Mild tricuspid regurgitation.  No evidence of pulmonary  hypertension.  Exercise Myoview stress test 03/29/2020: 1 Day Rest/Stress Protocol. Functional status: Good. Chest pain: Present, nonlimiting but relieved by sublingual nitro. Reason for stopping exercise: Fatigue. Hypertensive response to exercise: None. Patient exercised for 7 minutes and 33 seconds on Bruce protocol, achieved 9.42 METS, and 88% of APMHR.  Stress ECG positive for ischemia. No obvious evidence of reversible ischemia or prior infarct.  Calculated LVEF 47%, visually appears normal.  Left ventricular wall thickness is preserved without regional wall motion abnormalities. High risk study, due to chest pain with exercise relieved by sublingual nitro, stress ECG positive for ischemia with ectopy at peak exercise, mildly reduced LVEF per gated SPECT.  Clinical correlation required.  Recent labs: 07/19/2020: Glucose 88, BUN/Cr 24/1.12. EGFR 72. Na/K 140/4.8.  H/H 12/37. MCV 81. Platelets 325  04/28/2020: Glucose 97, BUN/Cr 13/1.04. EGFR 74. Na/K 139/4.3.  Chol 115, TG 70, HDL 37, LDL 63  02/29/2020: Glucose 77, BUN/Cr 17/0.98. EGFR 76. Na/K 139/4.3. Rest of the CMP normal H/H 14/41. MCV 83. Platelets 284 HbA1C 5.7% Chol 227, TG 191, HDL 37, LDL 155 Testosterone 266 low    Review of Systems  Constitutional: Positive for malaise/fatigue.  Cardiovascular:  Positive for chest pain (Improved) and palpitations. Negative for dyspnea on exertion, leg swelling and syncope.       Vitals:   09/14/20 0856  BP: (!) 150/74  Pulse: 72  Resp: 16  Temp: 98.3 F (36.8 C)  SpO2: 97%     Body mass index is 29.54 kg/m. Filed Weights   09/14/20 0856  Weight: 183 lb (83 kg)     Objective:   Physical Exam Vitals and nursing note reviewed.  Constitutional:      General: He is not in acute distress. Neck:     Vascular: No JVD.  Cardiovascular:     Rate and Rhythm: Normal rate and regular rhythm.     Heart sounds: Normal heart sounds. No murmur heard. Pulmonary:      Effort: Pulmonary effort is normal.     Breath sounds: Normal breath sounds. No wheezing or rales.  Musculoskeletal:     Right lower leg: No edema.     Left lower leg: No edema.        Assessment & Recommendations:   70 y.o. Caucasian male with hypertension, mixed hyperlipidemia, CAD with stable angina  CAD with stable angina: S/p LAD PCI (07/25/2020) Improved on metoprolol and Imdur. Recommend DPAT with Aspirin and Plavix at least till 01/2021  Palpitations: Recommend 2 week cardiac telemetry. Increased metoprolol succinate to 50 mg daily.   Hypertension: As above  Mixed hyperlipidemia: LDL down from 155 to 63.  Continue Lipitor 20 mg daily.  F/u in 4 weeks   Nigel Mormon, MD Pager: [phone] Office: [phone]

## 2020-09-14 ENCOUNTER — Ambulatory Visit: Payer: Medicare Other | Admitting: Cardiology

## 2020-09-14 ENCOUNTER — Encounter: Payer: Self-pay | Admitting: Cardiology

## 2020-09-14 ENCOUNTER — Inpatient Hospital Stay: Payer: Medicare Other

## 2020-09-14 ENCOUNTER — Other Ambulatory Visit: Payer: Self-pay

## 2020-09-14 VITALS — BP 150/74 | HR 72 | Temp 98.3°F | Resp 16 | Ht 66.0 in | Wt 183.0 lb

## 2020-09-14 DIAGNOSIS — E782 Mixed hyperlipidemia: Secondary | ICD-10-CM | POA: Diagnosis not present

## 2020-09-14 DIAGNOSIS — I25118 Atherosclerotic heart disease of native coronary artery with other forms of angina pectoris: Secondary | ICD-10-CM | POA: Diagnosis not present

## 2020-09-14 DIAGNOSIS — R002 Palpitations: Secondary | ICD-10-CM

## 2020-09-14 DIAGNOSIS — I208 Other forms of angina pectoris: Secondary | ICD-10-CM | POA: Diagnosis not present

## 2020-09-14 DIAGNOSIS — I2089 Other forms of angina pectoris: Secondary | ICD-10-CM

## 2020-09-14 MED ORDER — AMLODIPINE BESYLATE 5 MG PO TABS: 5.0000 mg | ORAL_TABLET | Freq: Every day | ORAL | 0 refills | Status: DC

## 2020-09-14 MED ORDER — METOPROLOL SUCCINATE ER 50 MG PO TB24: 50.0000 mg | ORAL_TABLET | Freq: Every day | ORAL | 3 refills | Status: DC

## 2020-10-03 DIAGNOSIS — R002 Palpitations: Secondary | ICD-10-CM | POA: Diagnosis not present

## 2020-10-13 ENCOUNTER — Encounter: Payer: Self-pay | Admitting: Cardiology

## 2020-10-13 ENCOUNTER — Other Ambulatory Visit: Payer: Self-pay

## 2020-10-13 ENCOUNTER — Ambulatory Visit: Payer: Medicare Other | Admitting: Cardiology

## 2020-10-13 VITALS — BP 141/76 | HR 65 | Temp 98.1°F | Resp 16 | Ht 66.0 in | Wt 186.8 lb

## 2020-10-13 DIAGNOSIS — I208 Other forms of angina pectoris: Secondary | ICD-10-CM | POA: Diagnosis not present

## 2020-10-13 DIAGNOSIS — I10 Essential (primary) hypertension: Secondary | ICD-10-CM | POA: Diagnosis not present

## 2020-10-13 DIAGNOSIS — R002 Palpitations: Secondary | ICD-10-CM

## 2020-10-13 DIAGNOSIS — E782 Mixed hyperlipidemia: Secondary | ICD-10-CM

## 2020-10-13 DIAGNOSIS — I25118 Atherosclerotic heart disease of native coronary artery with other forms of angina pectoris: Secondary | ICD-10-CM | POA: Diagnosis not present

## 2020-10-13 MED ORDER — CARVEDILOL 6.25 MG PO TABS: 6.2500 mg | ORAL_TABLET | Freq: Two times a day (BID) | ORAL | 3 refills | Status: DC

## 2020-10-13 NOTE — Progress Notes (Signed)
Patient referred by [name], MD for chest pain  Subjective:   Charles Allen, male    DOB: 1950/06/30, 70 y.o.   MRN: [id number]   Chief Complaint  Patient presents with   Palpitations   Coronary artery disease of native artery of native heart wi   Follow-up   Results    70 y.o. Caucasian male with hypertension, mixed hyperlipidemia, CAD s/p LAD PCI (07/2020), palpitations  Patient did not have significant palpitation symptoms where he would monitor.  After returning monitor, he did experience occasional symptoms, but not as bad as before.  He has noticed mood changes, feeling sad, since being on metoprolol.  Also, patient brings colonoscopy at some point to gastroenterology, for his deficiency.  He denies any melena or hematochezia at this time.  Patient has not  Patient had successful PCI to LAD on 07/25/2020. Chest pain has improved. He is tolerating metoprolol and Imdur well. He has had episodes of sudden heart rate increase, lasting for a few min.  Current Outpatient Medications on File Prior to Visit  Medication Sig Dispense Refill   amLODipine (NORVASC) 5 MG tablet Take 1 tablet (5 mg total) by mouth daily. 90 tablet 0   aspirin 81 MG chewable tablet Chew 1 tablet (81 mg total) by mouth daily. 90 tablet 3   atorvastatin (LIPITOR) 20 MG tablet Take 1 tablet (20 mg total) by mouth daily. 90 tablet 3   clopidogrel (PLAVIX) 75 MG tablet Take 1 tablet (75 mg total) by mouth daily. 90 tablet 3   Ferrous Sulfate (IRON) 325 (65 Fe) MG TABS Take 1 tablet by mouth daily.     isosorbide mononitrate (IMDUR) 30 MG 24 hr tablet Take 1 tablet (30 mg total) by mouth daily. 30 tablet 3   metoprolol succinate (TOPROL-XL) 50 MG 24 hr tablet Take 1 tablet (50 mg total) by mouth daily. TAKE 1 TABLET(25 MG) BY MOUTH DAILY WITH OR IMMEDIATELY FOLLOWING A MEAL 30 tablet 3   nitroGLYCERIN (NITROSTAT) 0.3 MG SL tablet Place 0.3 mg under the tongue every 5 (five) minutes as needed for chest pain.      testosterone (ANDROGEL) 50 MG/5GM (1%) GEL Place 5 g onto the skin daily. Apply to both shoulders once a day.     No current facility-administered medications on file prior to visit.    Cardiovascular and other pertinent studies:  Mobile cardiac telemetry 13 days 09/14/2020 - 09/28/2020: Dominant rhythm: Sinus. HR 45-115 bpm. Avg HR 67 bpm, in sinus rhythm. 3 episodes of SVT, fastest at 146 bpm for 4 beats, longest for 7 beats at 109 bpm. <1% isolated SVE, <1 couplet/triplets. 0 episodes of VT <1% isolated VE, <1 couplet/triplets. No atrial fibrillation/atrial flutter/*VT/high grade AV block, sinus pause >3sec noted. 1 patient triggered event correlated with sinus rhythm  EKG 08/16/2020: Sinus rhythm 68 bpm  Normal EKG  Coronary intervention 07/25/2020: LM: Normal LAD: Ostial 40-50% disease, followed by 95% proximal LAD stenosis, followed by diffuse 60% disease of the mid LAD LCx: Large dominant vessel, no significant disease RCA: Small nondominant vessel, no significant disease        Synergy DES 3.0X38 mm, post-dilated with 3.5 mm [place] balloon     Synergy DES 4.0X16 mm, post-dilated with 4.5 mm [place] balloon   LAD stenosis 95%-->0% TIMI flow I-->III  Coronary CTA 05/03/2020: 1. Coronary calcium score of 356. This was 2th percentile for age and sex matched control. 2. Normal coronary origin with left dominance. 3.  Ascending aortic atherosclerosis. 4.  Trileaflet aortic valve with valvular calcification. 5. CAD-RADS = 4. Mixed plaque in the proximal to mid LAD that accounts for moderate to severe stenosis. Moderate stenosis (50-69%) at the ostial segment of the first diagonal branch due to calcified plaque. Mild stenosis (25-49%) in the proximal to mid LCx due to calcified plaque. Mild stenosis (25-49%) in the proximal RCA due to mixed plaque. 6. Study is sent for CT-FFR to further evaluate the proximal to mid LAD stenosis. The findings will be performed and  reported separately.  Echocardiogram 03/30/2020:  Left ventricle cavity is normal in size and wall thickness. Normal global  wall motion. Normal LV systolic function with EF 70%. Normal diastolic  filling pattern.  Structurally normal trileaflet aortic valve.  Mild (Grade I) aortic  regurgitation.  Mild tricuspid regurgitation.  No evidence of pulmonary hypertension.  Exercise Myoview stress test 03/29/2020: 1 Day Rest/Stress Protocol. Functional status: Good. Chest pain: Present, nonlimiting but relieved by sublingual nitro. Reason for stopping exercise: Fatigue. Hypertensive response to exercise: None. Patient exercised for 7 minutes and 33 seconds on Bruce protocol, achieved 9.42 METS, and 88% of APMHR.  Stress ECG positive for ischemia. No obvious evidence of reversible ischemia or prior infarct.  Calculated LVEF 47%, visually appears normal.  Left ventricular wall thickness is preserved without regional wall motion abnormalities. High risk study, due to chest pain with exercise relieved by sublingual nitro, stress ECG positive for ischemia with ectopy at peak exercise, mildly reduced LVEF per gated SPECT.  Clinical correlation required.  Recent labs: 07/19/2020: Glucose 88, BUN/Cr 24/1.12. EGFR 72. Na/K 140/4.8.  H/H 12/37. MCV 81. Platelets 325  04/28/2020: Glucose 97, BUN/Cr 13/1.04. EGFR 74. Na/K 139/4.3.  Chol 115, TG 70, HDL 37, LDL 63  02/29/2020: Glucose 77, BUN/Cr 17/0.98. EGFR 76. Na/K 139/4.3. Rest of the CMP normal H/H 14/41. MCV 83. Platelets 284 HbA1C 5.7% Chol 227, TG 191, HDL 37, LDL 155 Testosterone 266 low    Review of Systems  Constitutional: Positive for malaise/fatigue.  Cardiovascular:  Positive for palpitations (Occasional). Negative for chest pain (Improved), dyspnea on exertion, leg swelling and syncope.       Vitals:   10/13/20 0920  BP: (!) 141/76  Pulse: 65  Resp: 16  Temp: 98.1 F (36.7 C)  SpO2: 96%    Body mass index is 30.15  kg/m. Filed Weights   10/13/20 0920  Weight: 186 lb 12.8 oz (84.7 kg)     Objective:   Physical Exam Vitals and nursing note reviewed.  Constitutional:      General: He is not in acute distress. Neck:     Vascular: No JVD.  Cardiovascular:     Rate and Rhythm: Normal rate and regular rhythm.     Heart sounds: Normal heart sounds. No murmur heard. Pulmonary:     Effort: Pulmonary effort is normal.     Breath sounds: Normal breath sounds. No wheezing or rales.  Musculoskeletal:     Right lower leg: No edema.     Left lower leg: No edema.        Assessment & Recommendations:   70 y.o. Caucasian male with hypertension, mixed hyperlipidemia, CAD s/p LAD PCI (07/2020), palpitations  CAD: S/p LAD PCI (07/25/2020) Angina resolved.  Okay to stop Imdur. Continue aspirin and Plavix.  Given his overlapping proximal LAD stents, I would like to treat him with Plavix for 1 year. However, Plavix and aspirin can be discontinued for 5 days for colonoscopy anytime  after 01/2021. Given mood changes, change metoprolol succinate 50 mg daily to carvedilol 6.25 mg twice daily. Continue Lipitor 20 mg daily.  LDL down from 155 to 63.  Palpitations: No significant arrhythmias on monitor.   Continue beta-blocker, as above, for now.    Hypertension: Slightly suboptimal.  Encourage patient to keep log of blood pressure readings at home, and communicate with me on MyChart.  Mixed hyperlipidemia: As above  F/u in 3 months    Nigel Mormon, MD Pager: [phone] Office: [phone]

## 2020-10-19 ENCOUNTER — Encounter (HOSPITAL_COMMUNITY): Payer: Self-pay

## 2020-10-19 ENCOUNTER — Telehealth (HOSPITAL_COMMUNITY): Payer: Self-pay

## 2020-10-19 NOTE — Telephone Encounter (Signed)
Attempted to call patient in regards to Cardiac Rehab - LM on VM Mailed letter 

## 2020-10-19 NOTE — Telephone Encounter (Signed)
Pt returned CR phone call and stated he is no longer interested b/c "it has been to long".   Closed referral

## 2020-11-06 NOTE — Telephone Encounter (Signed)
From pt

## 2020-11-13 DIAGNOSIS — D500 Iron deficiency anemia secondary to blood loss (chronic): Secondary | ICD-10-CM | POA: Diagnosis not present

## 2020-11-21 ENCOUNTER — Other Ambulatory Visit: Payer: Self-pay

## 2020-11-21 DIAGNOSIS — I208 Other forms of angina pectoris: Secondary | ICD-10-CM

## 2020-11-21 DIAGNOSIS — I25118 Atherosclerotic heart disease of native coronary artery with other forms of angina pectoris: Secondary | ICD-10-CM

## 2020-11-21 MED ORDER — ATORVASTATIN CALCIUM 20 MG PO TABS: 20.0000 mg | ORAL_TABLET | Freq: Every day | ORAL | 3 refills | Status: DC

## 2020-11-21 MED ORDER — AMLODIPINE BESYLATE 5 MG PO TABS: 5.0000 mg | ORAL_TABLET | Freq: Every day | ORAL | 0 refills | Status: DC

## 2020-11-21 MED ORDER — CLOPIDOGREL BISULFATE 75 MG PO TABS: 75.0000 mg | ORAL_TABLET | Freq: Every day | ORAL | 3 refills | Status: DC

## 2020-11-22 ENCOUNTER — Ambulatory Visit: Payer: Medicare Other | Admitting: Cardiology

## 2020-12-14 DIAGNOSIS — I493 Ventricular premature depolarization: Secondary | ICD-10-CM | POA: Diagnosis not present

## 2020-12-14 DIAGNOSIS — R011 Cardiac murmur, unspecified: Secondary | ICD-10-CM | POA: Diagnosis not present

## 2020-12-14 DIAGNOSIS — E7800 Pure hypercholesterolemia, unspecified: Secondary | ICD-10-CM | POA: Diagnosis not present

## 2020-12-14 DIAGNOSIS — I208 Other forms of angina pectoris: Secondary | ICD-10-CM | POA: Diagnosis not present

## 2020-12-14 DIAGNOSIS — D649 Anemia, unspecified: Secondary | ICD-10-CM | POA: Diagnosis not present

## 2020-12-14 DIAGNOSIS — E291 Testicular hypofunction: Secondary | ICD-10-CM | POA: Diagnosis not present

## 2020-12-14 DIAGNOSIS — R7303 Prediabetes: Secondary | ICD-10-CM | POA: Diagnosis not present

## 2020-12-14 DIAGNOSIS — Z79899 Other long term (current) drug therapy: Secondary | ICD-10-CM | POA: Diagnosis not present

## 2020-12-14 DIAGNOSIS — D500 Iron deficiency anemia secondary to blood loss (chronic): Secondary | ICD-10-CM | POA: Diagnosis not present

## 2020-12-14 DIAGNOSIS — E559 Vitamin D deficiency, unspecified: Secondary | ICD-10-CM | POA: Diagnosis not present

## 2020-12-15 DIAGNOSIS — R7303 Prediabetes: Secondary | ICD-10-CM | POA: Diagnosis not present

## 2020-12-15 DIAGNOSIS — D500 Iron deficiency anemia secondary to blood loss (chronic): Secondary | ICD-10-CM | POA: Diagnosis not present

## 2020-12-15 DIAGNOSIS — E291 Testicular hypofunction: Secondary | ICD-10-CM | POA: Diagnosis not present

## 2020-12-15 DIAGNOSIS — E559 Vitamin D deficiency, unspecified: Secondary | ICD-10-CM | POA: Diagnosis not present

## 2020-12-15 DIAGNOSIS — E7800 Pure hypercholesterolemia, unspecified: Secondary | ICD-10-CM | POA: Diagnosis not present

## 2020-12-22 DIAGNOSIS — D500 Iron deficiency anemia secondary to blood loss (chronic): Secondary | ICD-10-CM | POA: Diagnosis not present

## 2020-12-22 DIAGNOSIS — I2510 Atherosclerotic heart disease of native coronary artery without angina pectoris: Secondary | ICD-10-CM | POA: Diagnosis not present

## 2021-01-17 ENCOUNTER — Other Ambulatory Visit: Payer: Self-pay

## 2021-01-17 ENCOUNTER — Telehealth: Payer: Self-pay

## 2021-01-17 DIAGNOSIS — I25118 Atherosclerotic heart disease of native coronary artery with other forms of angina pectoris: Secondary | ICD-10-CM

## 2021-01-17 DIAGNOSIS — I10 Essential (primary) hypertension: Secondary | ICD-10-CM

## 2021-01-17 MED ORDER — CARVEDILOL 6.25 MG PO TABS: 6.2500 mg | ORAL_TABLET | Freq: Two times a day (BID) | ORAL | 3 refills | Status: DC

## 2021-01-17 NOTE — Telephone Encounter (Signed)
Plavix (clopidogrel) can be stopped. Carvedilol should continue.   Thanks MJP

## 2021-01-17 NOTE — Telephone Encounter (Signed)
Pt aware, updated pts MAR. Refill sent in.

## 2021-01-17 NOTE — Telephone Encounter (Signed)
Pt is under the impression that he was only suppose to take 90 days worth of the carvedilol and then stop the medication. I am not seeing in the office note that you ever planned to stop it. Please advise.

## 2021-01-29 DIAGNOSIS — H10023 Other mucopurulent conjunctivitis, bilateral: Secondary | ICD-10-CM | POA: Diagnosis not present

## 2021-02-16 ENCOUNTER — Encounter: Payer: Self-pay | Admitting: Cardiology

## 2021-02-16 ENCOUNTER — Other Ambulatory Visit: Payer: Self-pay

## 2021-02-16 ENCOUNTER — Ambulatory Visit: Payer: Medicare Other | Admitting: Cardiology

## 2021-02-16 VITALS — BP 141/86 | HR 62 | Temp 97.8°F | Resp 16 | Ht 66.0 in | Wt 188.0 lb

## 2021-02-16 DIAGNOSIS — I10 Essential (primary) hypertension: Secondary | ICD-10-CM

## 2021-02-16 DIAGNOSIS — I208 Other forms of angina pectoris: Secondary | ICD-10-CM | POA: Diagnosis not present

## 2021-02-16 DIAGNOSIS — R011 Cardiac murmur, unspecified: Secondary | ICD-10-CM | POA: Diagnosis not present

## 2021-02-16 DIAGNOSIS — E782 Mixed hyperlipidemia: Secondary | ICD-10-CM

## 2021-02-16 DIAGNOSIS — I25118 Atherosclerotic heart disease of native coronary artery with other forms of angina pectoris: Secondary | ICD-10-CM

## 2021-02-16 NOTE — Progress Notes (Addendum)
Patient referred by [name], MD for chest pain  Subjective:   Charles Allen, male    DOB: 04-Oct-1950, 70 y.o.   MRN: [id number]   Chief Complaint  Patient presents with   Coronary Artery Disease     70 y.o. Caucasian male with hypertension, mixed hyperlipidemia, CAD s/p LAD PCI (07/2020)  Patient is doing well.  He is not doing much physical activity, but has not had any chest pain.  He is going to undergo colonoscopy next week due to melena.  He is holding Plavix for the same.  Current Outpatient Medications on File Prior to Visit  Medication Sig Dispense Refill   amLODipine (NORVASC) 5 MG tablet Take 1 tablet (5 mg total) by mouth daily. 90 tablet 0   aspirin 81 MG chewable tablet Chew 1 tablet (81 mg total) by mouth daily. 90 tablet 3   atorvastatin (LIPITOR) 20 MG tablet Take 1 tablet (20 mg total) by mouth daily. 90 tablet 3   carvedilol (COREG) 6.25 MG tablet Take 1 tablet (6.25 mg total) by mouth 2 (two) times daily. 60 tablet 3   Ferrous Sulfate (IRON) 325 (65 Fe) MG TABS Take 1 tablet by mouth daily.     nitroGLYCERIN (NITROSTAT) 0.3 MG SL tablet Place 0.3 mg under the tongue every 5 (five) minutes as needed for chest pain.     testosterone (ANDROGEL) 50 MG/5GM (1%) GEL Place 5 g onto the skin daily. Apply to both shoulders once a day.     No current facility-administered medications on file prior to visit.    Cardiovascular and other pertinent studies:  EKG 02/16/2021: Sinus rhythm  Normal EKG  Mobile cardiac telemetry 13 days 09/14/2020 - 09/28/2020: Dominant rhythm: Sinus. HR 45-115 bpm. Avg HR 67 bpm, in sinus rhythm. 3 episodes of SVT, fastest at 146 bpm for 4 beats, longest for 7 beats at 109 bpm. <1% isolated SVE, <1 couplet/triplets. 0 episodes of VT <1% isolated VE, <1 couplet/triplets. No atrial fibrillation/atrial flutter/*VT/high grade AV block, sinus pause >3sec noted. 1 patient triggered event correlated with sinus rhythm  Coronary  intervention 07/25/2020: LM: Normal LAD: Ostial 40-50% disease, followed by 95% proximal LAD stenosis, followed by diffuse 60% disease of the mid LAD LCx: Large dominant vessel, no significant disease RCA: Small nondominant vessel, no significant disease        Synergy DES 3.0X38 mm, post-dilated with 3.5 mm Loraine balloon     Synergy DES 4.0X16 mm, post-dilated with 4.5 mm  balloon   LAD stenosis 95%-->0% TIMI flow I-->III  Coronary CTA 05/03/2020: 1. Coronary calcium score of 356. This was 72th percentile for age and sex matched control. 2. Normal coronary origin with left dominance. 3.  Ascending aortic atherosclerosis. 4.  Trileaflet aortic valve with valvular calcification. 5. CAD-RADS = 4. Mixed plaque in the proximal to mid LAD that accounts for moderate to severe stenosis. Moderate stenosis (50-69%) at the ostial segment of the first diagonal branch due to calcified plaque. Mild stenosis (25-49%) in the proximal to mid LCx due to calcified plaque. Mild stenosis (25-49%) in the proximal RCA due to mixed plaque. 6. Study is sent for CT-FFR to further evaluate the proximal to mid LAD stenosis. The findings will be performed and reported separately.  Echocardiogram 03/30/2020:  Left ventricle cavity is normal in size and wall thickness. Normal global  wall motion. Normal LV systolic function with EF 70%. Normal diastolic  filling pattern.  Structurally normal trileaflet aortic valve.  Mild (  Grade I) aortic  regurgitation.  Mild tricuspid regurgitation.  No evidence of pulmonary hypertension.  Exercise Myoview stress test 03/29/2020: 1 Day Rest/Stress Protocol. Functional status: Good. Chest pain: Present, nonlimiting but relieved by sublingual nitro. Reason for stopping exercise: Fatigue. Hypertensive response to exercise: None. Patient exercised for 7 minutes and 33 seconds on Bruce protocol, achieved 9.42 METS, and 88% of APMHR.  Stress ECG positive for ischemia. No  obvious evidence of reversible ischemia or prior infarct.  Calculated LVEF 47%, visually appears normal.  Left ventricular wall thickness is preserved without regional wall motion abnormalities. High risk study, due to chest pain with exercise relieved by sublingual nitro, stress ECG positive for ischemia with ectopy at peak exercise, mildly reduced LVEF per gated SPECT.  Clinical correlation required.  Recent labs: 07/19/2020: Glucose 88, BUN/Cr 24/1.12. EGFR 72. Na/K 140/4.8.  H/H 12/37. MCV 81. Platelets 325  04/28/2020: Glucose 97, BUN/Cr 13/1.04. EGFR 74. Na/K 139/4.3.  Chol 115, TG 70, HDL 37, LDL 63  02/29/2020: Glucose 77, BUN/Cr 17/0.98. EGFR 76. Na/K 139/4.3. Rest of the CMP normal H/H 14/41. MCV 83. Platelets 284 HbA1C 5.7% Chol 227, TG 191, HDL 37, LDL 155 Testosterone 266 low    Review of Systems  Constitutional: Positive for malaise/fatigue.  Cardiovascular:  Positive for palpitations (Occasional). Negative for chest pain (Improved), dyspnea on exertion, leg swelling and syncope.       Vitals:   02/16/21 1341  BP: (!) 141/86  Pulse: 62  Resp: 16  Temp: 97.8 F (36.6 C)  SpO2: 98%    Body mass index is 30.34 kg/m. Filed Weights   02/16/21 1341  Weight: 188 lb (85.3 kg)     Objective:   Physical Exam Vitals and nursing note reviewed.  Constitutional:      General: He is not in acute distress. Neck:     Vascular: No JVD.  Cardiovascular:     Rate and Rhythm: Normal rate and regular rhythm.     Heart sounds: Murmur heard.  High-pitched blowing decrescendo early diastolic murmur is present with a grade of 2/4 at the upper right sternal border radiating to the apex.  Pulmonary:     Effort: Pulmonary effort is normal.     Breath sounds: Normal breath sounds. No wheezing or rales.  Musculoskeletal:     Right lower leg: No edema.     Left lower leg: No edema.        Assessment & Recommendations:   70 y.o. Caucasian male with hypertension, mixed  hyperlipidemia, CAD s/p LAD PCI (07/2020), palpitations  CAD: S/p LAD PCI (07/25/2020) Angina resolved.  Okay to stop Imdur. Continue aspirin and Plavix.  Given his overlapping proximal LAD stents, I would like to treat him with Plavix for 1 year. However, okay to hold Plavix for 5 days for colonoscopy, as he is currently doing. Continue carvedilol 6.25 mg twice daily.  He did not tolerate metoprolol due to mood changes. Continue Lipitor 20 mg daily.  LDL down from 155 to 63.  Palpitations: No significant arrhythmias on monitor.   Continue beta-blocker, as above, for now.    Hypertension: Mildly elevated.  Encouraged him to increase his physical activity.  Murmur: His diastolic murmur slightly more prominent.  We will check echocardiogram.  Mixed hyperlipidemia: As above  F/u in 6 months    Nigel Mormon, MD Pager: [phone] Office: [phone]

## 2021-02-20 ENCOUNTER — Other Ambulatory Visit: Payer: Self-pay

## 2021-02-20 DIAGNOSIS — I208 Other forms of angina pectoris: Secondary | ICD-10-CM

## 2021-02-20 DIAGNOSIS — D123 Benign neoplasm of transverse colon: Secondary | ICD-10-CM | POA: Diagnosis not present

## 2021-02-20 DIAGNOSIS — K2930 Chronic superficial gastritis without bleeding: Secondary | ICD-10-CM | POA: Diagnosis not present

## 2021-02-20 DIAGNOSIS — K648 Other hemorrhoids: Secondary | ICD-10-CM | POA: Diagnosis not present

## 2021-02-20 DIAGNOSIS — I25118 Atherosclerotic heart disease of native coronary artery with other forms of angina pectoris: Secondary | ICD-10-CM

## 2021-02-20 DIAGNOSIS — B9681 Helicobacter pylori [H. pylori] as the cause of diseases classified elsewhere: Secondary | ICD-10-CM | POA: Diagnosis not present

## 2021-02-20 DIAGNOSIS — K5730 Diverticulosis of large intestine without perforation or abscess without bleeding: Secondary | ICD-10-CM | POA: Diagnosis not present

## 2021-02-20 DIAGNOSIS — D509 Iron deficiency anemia, unspecified: Secondary | ICD-10-CM | POA: Diagnosis not present

## 2021-02-20 MED ORDER — CLOPIDOGREL BISULFATE 75 MG PO TABS: 75.0000 mg | ORAL_TABLET | Freq: Every day | ORAL | 3 refills | Status: DC

## 2021-02-20 MED ORDER — ATORVASTATIN CALCIUM 20 MG PO TABS: 20.0000 mg | ORAL_TABLET | Freq: Every day | ORAL | 3 refills | Status: DC

## 2021-02-20 MED ORDER — AMLODIPINE BESYLATE 5 MG PO TABS: 5.0000 mg | ORAL_TABLET | Freq: Every day | ORAL | 3 refills | Status: DC

## 2021-02-21 ENCOUNTER — Ambulatory Visit: Payer: Medicare Other | Admitting: Cardiology

## 2021-02-26 DIAGNOSIS — B9681 Helicobacter pylori [H. pylori] as the cause of diseases classified elsewhere: Secondary | ICD-10-CM | POA: Diagnosis not present

## 2021-02-26 DIAGNOSIS — D123 Benign neoplasm of transverse colon: Secondary | ICD-10-CM | POA: Diagnosis not present

## 2021-02-26 DIAGNOSIS — H10023 Other mucopurulent conjunctivitis, bilateral: Secondary | ICD-10-CM | POA: Diagnosis not present

## 2021-02-26 DIAGNOSIS — K2930 Chronic superficial gastritis without bleeding: Secondary | ICD-10-CM | POA: Diagnosis not present

## 2021-02-27 ENCOUNTER — Ambulatory Visit: Payer: Medicare Other

## 2021-02-27 ENCOUNTER — Other Ambulatory Visit: Payer: Self-pay

## 2021-02-27 DIAGNOSIS — R011 Cardiac murmur, unspecified: Secondary | ICD-10-CM

## 2021-02-27 DIAGNOSIS — I10 Essential (primary) hypertension: Secondary | ICD-10-CM

## 2021-03-05 DIAGNOSIS — K219 Gastro-esophageal reflux disease without esophagitis: Secondary | ICD-10-CM | POA: Diagnosis not present

## 2021-03-05 DIAGNOSIS — R011 Cardiac murmur, unspecified: Secondary | ICD-10-CM | POA: Diagnosis not present

## 2021-03-05 DIAGNOSIS — Z86010 Personal history of colonic polyps: Secondary | ICD-10-CM | POA: Diagnosis not present

## 2021-03-05 DIAGNOSIS — A048 Other specified bacterial intestinal infections: Secondary | ICD-10-CM | POA: Diagnosis not present

## 2021-03-12 ENCOUNTER — Encounter: Payer: Self-pay | Admitting: Cardiology

## 2021-03-13 NOTE — Telephone Encounter (Signed)
From pt

## 2021-04-05 DIAGNOSIS — D649 Anemia, unspecified: Secondary | ICD-10-CM | POA: Diagnosis not present

## 2021-04-27 ENCOUNTER — Other Ambulatory Visit: Payer: Self-pay

## 2021-04-27 DIAGNOSIS — I25118 Atherosclerotic heart disease of native coronary artery with other forms of angina pectoris: Secondary | ICD-10-CM

## 2021-04-27 DIAGNOSIS — I10 Essential (primary) hypertension: Secondary | ICD-10-CM

## 2021-04-27 MED ORDER — CARVEDILOL 6.25 MG PO TABS: 6.2500 mg | ORAL_TABLET | Freq: Two times a day (BID) | ORAL | 3 refills | Status: DC

## 2021-05-25 ENCOUNTER — Encounter: Payer: Self-pay | Admitting: Cardiology

## 2021-05-25 NOTE — Telephone Encounter (Signed)
From patient.

## 2021-05-30 ENCOUNTER — Telehealth: Payer: Self-pay | Admitting: Cardiology

## 2021-05-30 ENCOUNTER — Other Ambulatory Visit: Payer: Self-pay

## 2021-05-30 DIAGNOSIS — I25118 Atherosclerotic heart disease of native coronary artery with other forms of angina pectoris: Secondary | ICD-10-CM

## 2021-05-30 DIAGNOSIS — I208 Other forms of angina pectoris: Secondary | ICD-10-CM

## 2021-05-30 MED ORDER — AMLODIPINE BESYLATE 5 MG PO TABS: 5.0000 mg | ORAL_TABLET | Freq: Every day | ORAL | 0 refills | Status: DC

## 2021-05-30 MED ORDER — CLOPIDOGREL BISULFATE 75 MG PO TABS: 75.0000 mg | ORAL_TABLET | Freq: Every day | ORAL | 3 refills | Status: DC

## 2021-05-30 MED ORDER — ATORVASTATIN CALCIUM 20 MG PO TABS: 20.0000 mg | ORAL_TABLET | Freq: Every day | ORAL | 3 refills | Status: DC

## 2021-05-30 MED ORDER — ATORVASTATIN CALCIUM 20 MG PO TABS: 20.0000 mg | ORAL_TABLET | Freq: Every day | ORAL | 0 refills | Status: DC

## 2021-05-30 NOTE — Telephone Encounter (Signed)
Refills have been sent to pts preferred pharmacy.

## 2021-05-30 NOTE — Telephone Encounter (Signed)
Pt req refill needs call to provide new pharmacy ? ?atorvastatin (LIPITOR) 20 MG tablet (Expired) ? ?clopidogrel (PLAVIX) 75 MG tableT ? ? ? ?

## 2021-05-30 NOTE — Telephone Encounter (Signed)
Ok done

## 2021-05-30 NOTE — Telephone Encounter (Signed)
Patient requesting refill on atorvastatin 20 MG, clopidogrel 75 MG, and he is low on amlodipine 5 MG so he's wondering if he can get that one as well. He's requesting these be sent to [facility] in [facility].[name] address is [address], [place] [address]. ?

## 2021-08-17 ENCOUNTER — Ambulatory Visit: Payer: Medicare Other | Admitting: Cardiology

## 2021-08-20 ENCOUNTER — Encounter: Payer: Self-pay | Admitting: Cardiology

## 2021-08-21 NOTE — Telephone Encounter (Signed)
From patient.

## 2021-08-21 NOTE — Telephone Encounter (Signed)
Mr. Wentzell,  Jim Desanctis yo hear that you are doing well. If doing well, it is unlikely that you will need any tests. If you are already in [place], I will be happy to make the upcoming visit as a "virtual visit". Please let us know.  Regards, Dr. Virgina Jock

## 2021-08-30 ENCOUNTER — Other Ambulatory Visit: Payer: Self-pay

## 2021-08-30 ENCOUNTER — Ambulatory Visit: Payer: Medicare Other | Admitting: Cardiology

## 2021-08-30 ENCOUNTER — Encounter: Payer: Self-pay | Admitting: Cardiology

## 2021-08-30 VITALS — BP 141/81 | HR 65 | Temp 98.0°F | Resp 16 | Ht 66.0 in | Wt 184.6 lb

## 2021-08-30 DIAGNOSIS — I208 Other forms of angina pectoris: Secondary | ICD-10-CM | POA: Diagnosis not present

## 2021-08-30 DIAGNOSIS — Z8619 Personal history of other infectious and parasitic diseases: Secondary | ICD-10-CM | POA: Diagnosis not present

## 2021-08-30 DIAGNOSIS — E782 Mixed hyperlipidemia: Secondary | ICD-10-CM

## 2021-08-30 DIAGNOSIS — I10 Essential (primary) hypertension: Secondary | ICD-10-CM | POA: Diagnosis not present

## 2021-08-30 DIAGNOSIS — I25118 Atherosclerotic heart disease of native coronary artery with other forms of angina pectoris: Secondary | ICD-10-CM

## 2021-08-30 MED ORDER — CARVEDILOL 12.5 MG PO TABS: 12.5000 mg | ORAL_TABLET | Freq: Two times a day (BID) | ORAL | 3 refills | Status: AC

## 2021-08-30 NOTE — Progress Notes (Signed)
Patient referred by [name], MD for chest pain  Subjective:   Charles Allen, male    DOB: [birth year], 71 y.o.   MRN: [id number]   Chief Complaint  Patient presents with   Coronary Artery Disease   Follow-up     71 y.o. Caucasian male with hypertension, mixed hyperlipidemia, CAD s/p LAD PCI (07/2020)  Patient is doing well. He has moved to [place].  He does not have any established primary care provider there is here.  He denies chest pain, shortness of breath, palpitations, leg edema, orthopnea, PND, TIA/syncope.  Blood pressure remains slightly elevated.    Current Outpatient Medications:    amLODipine (NORVASC) 5 MG tablet, Take 1 tablet (5 mg total) by mouth daily., Disp: 90 tablet, Rfl: 0   aspirin 81 MG chewable tablet, Chew 1 tablet (81 mg total) by mouth daily., Disp: 90 tablet, Rfl: 3   atorvastatin (LIPITOR) 20 MG tablet, Take 1 tablet (20 mg total) by mouth daily., Disp: 90 tablet, Rfl: 0   BISACODYL 5 MG EC tablet, Take 20 mg by mouth as directed., Disp: , Rfl:    carvedilol (COREG) 6.25 MG tablet, Take 1 tablet (6.25 mg total) by mouth 2 (two) times daily., Disp: 60 tablet, Rfl: 3   clopidogrel (PLAVIX) 75 MG tablet, Take 1 tablet (75 mg total) by mouth daily., Disp: 90 tablet, Rfl: 3   Ferrous Sulfate (IRON) 325 (65 Fe) MG TABS, Take 1 tablet by mouth daily., Disp: , Rfl:    nitroGLYCERIN (NITROSTAT) 0.3 MG SL tablet, Place 0.3 mg under the tongue every 5 (five) minutes as needed for chest pain., Disp: , Rfl:   Cardiovascular and other pertinent studies:  EKG 08/30/2021: Sinus rhythm 65 bpm  Normal EKG  Mobile cardiac telemetry 13 days 09/14/2020 - 09/28/2020: Dominant rhythm: Sinus. HR 45-115 bpm. Avg HR 67 bpm, in sinus rhythm. 3 episodes of SVT, fastest at 146 bpm for 4 beats, longest for 7 beats at 109 bpm. <1% isolated SVE, <1 couplet/triplets. 0 episodes of VT <1% isolated VE, <1 couplet/triplets. No atrial fibrillation/atrial flutter/*VT/high  grade AV block, sinus pause >3sec noted. 1 patient triggered event correlated with sinus rhythm  Coronary intervention 07/25/2020: LM: Normal LAD: Ostial 40-50% disease, followed by 95% proximal LAD stenosis, followed by diffuse 60% disease of the mid LAD LCx: Large dominant vessel, no significant disease RCA: Small nondominant vessel, no significant disease        Synergy DES 3.0X38 mm, post-dilated with 3.5 mm [employer] balloon     Synergy DES 4.0X16 mm, post-dilated with 4.5 mm Arthur balloon   LAD stenosis 95%-->0% TIMI flow I-->III  Coronary CTA 05/03/2020: 1. Coronary calcium score of 356. This was 26th percentile for age and sex matched control. 2. Normal coronary origin with left dominance. 3.  Ascending aortic atherosclerosis. 4.  Trileaflet aortic valve with valvular calcification. 5. CAD-RADS = 4. Mixed plaque in the proximal to mid LAD that accounts for moderate to severe stenosis. Moderate stenosis (50-69%) at the ostial segment of the first diagonal branch due to calcified plaque. Mild stenosis (25-49%) in the proximal to mid LCx due to calcified plaque. Mild stenosis (25-49%) in the proximal RCA due to mixed plaque. 6. Study is sent for CT-FFR to further evaluate the proximal to mid LAD stenosis. The findings will be performed and reported separately.  Echocardiogram 03/30/2020:  Left ventricle cavity is normal in size and wall thickness. Normal global  wall motion. Normal LV systolic function with  EF 70%. Normal diastolic  filling pattern.  Structurally normal trileaflet aortic valve.  Mild (Grade I) aortic  regurgitation.  Mild tricuspid regurgitation.  No evidence of pulmonary hypertension.  Exercise Myoview stress test 03/29/2020: 1 Day Rest/Stress Protocol. Functional status: Good. Chest pain: Present, nonlimiting but relieved by sublingual nitro. Reason for stopping exercise: Fatigue. Hypertensive response to exercise: None. Patient exercised for 7 minutes and 33  seconds on Bruce protocol, achieved 9.42 METS, and 88% of APMHR.  Stress ECG positive for ischemia. No obvious evidence of reversible ischemia or prior infarct.  Calculated LVEF 47%, visually appears normal.  Left ventricular wall thickness is preserved without regional wall motion abnormalities. High risk study, due to chest pain with exercise relieved by sublingual nitro, stress ECG positive for ischemia with ectopy at peak exercise, mildly reduced LVEF per gated SPECT.  Clinical correlation required.  Recent labs: 07/19/2020: Glucose 88, BUN/Cr 24/1.12. EGFR 72. Na/K 140/4.8.  H/H 12/37. MCV 81. Platelets 325  04/28/2020: Glucose 97, BUN/Cr 13/1.04. EGFR 74. Na/K 139/4.3.  Chol 115, TG 70, HDL 37, LDL 63  02/29/2020: Glucose 77, BUN/Cr 17/0.98. EGFR 76. Na/K 139/4.3. Rest of the CMP normal H/H 14/41. MCV 83. Platelets 284 HbA1C 5.7% Chol 227, TG 191, HDL 37, LDL 155 Testosterone 266 low    Review of Systems  Constitutional: Positive for malaise/fatigue.  Cardiovascular:  Negative for chest pain (Improved), dyspnea on exertion, leg swelling, palpitations and syncope.        Vitals:   08/30/21 1017  BP: (!) 141/81  Pulse: 65  Resp: 16  Temp: 98 F (36.7 C)  SpO2: 92%    Body mass index is 29.8 kg/m. Filed Weights   08/30/21 1017  Weight: 184 lb 9.6 oz (83.7 kg)     Objective:   Physical Exam Vitals and nursing note reviewed.  Constitutional:      General: He is not in acute distress. Neck:     Vascular: No JVD.  Cardiovascular:     Rate and Rhythm: Normal rate and regular rhythm.     Heart sounds: Murmur heard.     High-pitched blowing decrescendo early diastolic murmur is present with a grade of 2/4 at the upper right sternal border radiating to the apex.  Pulmonary:     Effort: Pulmonary effort is normal.     Breath sounds: Normal breath sounds. No wheezing or rales.  Musculoskeletal:     Right lower leg: No edema.     Left lower leg: No edema.          Assessment & Recommendations:   71 y.o. Caucasian male with hypertension, mixed hyperlipidemia, CAD s/p LAD PCI (07/2020), palpitations  CAD: S/p LAD PCI (07/25/2020) Completed 1 year DAPT. Continue aspirin 80 mg daily. To Lipitor 20 mg daily.  LDL down to 63 in 2022.  We will check lipid panel. See below regarding hypertension management.  Hypertension: Suboptimal control.   In light that he is leaving for [place] soon with no immediate plans to return to [facility], increase carvedilol to 12.5 mg twice daily.  In future, this can be changed to other antihypertensive agents that will need close follow-up with labs 6 etc.  Could consider switching to ACE/ARB in future.    He is to also continue with serial follow-up with me, until he establishes care with another provider in [place].  We will check BMP and lipid panel today.  F/u in 1 year    Nigel Mormon, MD Pager: [phone]  Office: [phone]

## 2021-08-31 DIAGNOSIS — Z8619 Personal history of other infectious and parasitic diseases: Secondary | ICD-10-CM | POA: Diagnosis not present

## 2021-08-31 LAB — BASIC METABOLIC PANEL
BUN/Creatinine Ratio: 16 (ref 10–24)
BUN: 16 mg/dL (ref 8–27)
CO2: 21 mmol/L (ref 20–29)
Calcium: 9.7 mg/dL (ref 8.6–10.2)
Chloride: 104 mmol/L (ref 96–106)
Creatinine, Ser: 1.02 mg/dL (ref 0.76–1.27)
Glucose: 101 mg/dL — ABNORMAL HIGH (ref 70–99)
Potassium: 4.5 mmol/L (ref 3.5–5.2)
Sodium: 143 mmol/L (ref 134–144)
eGFR: 80 mL/min/{1.73_m2} (ref >59)

## 2021-08-31 LAB — LIPID PANEL WITH LDL/HDL RATIO
Cholesterol, Total: 129 mg/dL (ref 100–199)
HDL: 33 mg/dL — ABNORMAL LOW (ref >39)
LDL Chol Calc (NIH): 74 mg/dL (ref 0–99)
LDL/HDL Ratio: 2.2 ratio (ref 0.0–3.6)
Triglycerides: 118 mg/dL (ref 0–149)
VLDL Cholesterol Cal: 22 mg/dL (ref 5–40)

## 2021-08-31 MED ORDER — ATORVASTATIN CALCIUM 40 MG PO TABS: 40.0000 mg | ORAL_TABLET | Freq: Every day | ORAL | 3 refills | Status: DC

## 2021-09-04 ENCOUNTER — Encounter: Payer: Self-pay | Admitting: Cardiology

## 2021-09-05 NOTE — Telephone Encounter (Signed)
From patient.

## 2021-10-25 DIAGNOSIS — R051 Acute cough: Secondary | ICD-10-CM | POA: Diagnosis not present

## 2021-10-30 DIAGNOSIS — E785 Hyperlipidemia, unspecified: Secondary | ICD-10-CM | POA: Diagnosis not present

## 2021-10-30 DIAGNOSIS — Z20822 Contact with and (suspected) exposure to covid-19: Secondary | ICD-10-CM | POA: Diagnosis not present

## 2021-10-30 DIAGNOSIS — I252 Old myocardial infarction: Secondary | ICD-10-CM | POA: Diagnosis not present

## 2021-10-30 DIAGNOSIS — Z955 Presence of coronary angioplasty implant and graft: Secondary | ICD-10-CM | POA: Diagnosis not present

## 2021-10-30 DIAGNOSIS — I2510 Atherosclerotic heart disease of native coronary artery without angina pectoris: Secondary | ICD-10-CM | POA: Diagnosis not present

## 2021-10-30 DIAGNOSIS — J40 Bronchitis, not specified as acute or chronic: Secondary | ICD-10-CM | POA: Diagnosis not present

## 2021-10-30 DIAGNOSIS — I10 Essential (primary) hypertension: Secondary | ICD-10-CM | POA: Diagnosis not present

## 2021-11-13 DIAGNOSIS — Z955 Presence of coronary angioplasty implant and graft: Secondary | ICD-10-CM | POA: Diagnosis not present

## 2021-11-13 DIAGNOSIS — E785 Hyperlipidemia, unspecified: Secondary | ICD-10-CM | POA: Diagnosis not present

## 2021-11-13 DIAGNOSIS — I2510 Atherosclerotic heart disease of native coronary artery without angina pectoris: Secondary | ICD-10-CM | POA: Diagnosis not present

## 2021-11-13 DIAGNOSIS — R7301 Impaired fasting glucose: Secondary | ICD-10-CM | POA: Diagnosis not present

## 2021-11-13 DIAGNOSIS — Z125 Encounter for screening for malignant neoplasm of prostate: Secondary | ICD-10-CM | POA: Diagnosis not present

## 2021-11-13 DIAGNOSIS — I10 Essential (primary) hypertension: Secondary | ICD-10-CM | POA: Diagnosis not present

## 2021-11-13 DIAGNOSIS — J069 Acute upper respiratory infection, unspecified: Secondary | ICD-10-CM | POA: Diagnosis not present

## 2022-01-30 DIAGNOSIS — U071 COVID-19: Secondary | ICD-10-CM | POA: Diagnosis not present

## 2022-01-30 DIAGNOSIS — E785 Hyperlipidemia, unspecified: Secondary | ICD-10-CM | POA: Diagnosis not present

## 2022-08-29 ENCOUNTER — Encounter: Payer: Self-pay | Admitting: Cardiology

## 2022-09-05 ENCOUNTER — Other Ambulatory Visit: Payer: Self-pay | Admitting: Cardiology

## 2022-09-05 DIAGNOSIS — E782 Mixed hyperlipidemia: Secondary | ICD-10-CM

## 2022-09-05 DIAGNOSIS — I25118 Atherosclerotic heart disease of native coronary artery with other forms of angina pectoris: Secondary | ICD-10-CM

## 2022-09-27 ENCOUNTER — Other Ambulatory Visit: Payer: Self-pay | Admitting: Cardiology

## 2022-09-27 DIAGNOSIS — I10 Essential (primary) hypertension: Secondary | ICD-10-CM

## 2022-09-27 DIAGNOSIS — I2089 Other forms of angina pectoris: Secondary | ICD-10-CM

## 2022-09-27 DIAGNOSIS — I25118 Atherosclerotic heart disease of native coronary artery with other forms of angina pectoris: Secondary | ICD-10-CM

## 2022-11-21 NOTE — Progress Notes (Signed)
This encounter was created in error - please disregard.

## 2023-03-18 ENCOUNTER — Other Ambulatory Visit: Payer: Self-pay | Admitting: Cardiology

## 2023-03-18 DIAGNOSIS — I2089 Other forms of angina pectoris: Secondary | ICD-10-CM
